# Patient Record
Sex: Female | Born: 1980 | Hispanic: No | Marital: Married | Smoking: Never smoker
Health system: Southern US, Community
[De-identification: ages and names within clinical notes are randomized; demographics above are authoritative.]

## PROBLEM LIST (undated history)

## (undated) DIAGNOSIS — E079 Disorder of thyroid, unspecified: Secondary | ICD-10-CM

---

## 2008-06-20 ENCOUNTER — Ambulatory Visit: Payer: Self-pay | Admitting: Family Medicine

## 2008-06-29 ENCOUNTER — Ambulatory Visit: Payer: Self-pay | Admitting: Family Medicine

## 2008-09-23 ENCOUNTER — Emergency Department: Payer: Self-pay | Admitting: Emergency Medicine

## 2009-01-22 ENCOUNTER — Observation Stay: Payer: Self-pay

## 2009-01-31 ENCOUNTER — Inpatient Hospital Stay: Payer: Self-pay

## 2010-03-01 IMAGING — US US OB < 14 WEEKS - US OB TV
1 series · 17 of 28 positions shown · non-contrast
Comparison: none

REASON FOR EXAM: acute abd pain    approx 5 weeks preg   eval ectopic
pregnancy   needs spain...
COMMENTS:

[Series 1: us ob < 14 weeks - us ob tv · 49 acquisitions, 17 frames shown]
[im 1/49]
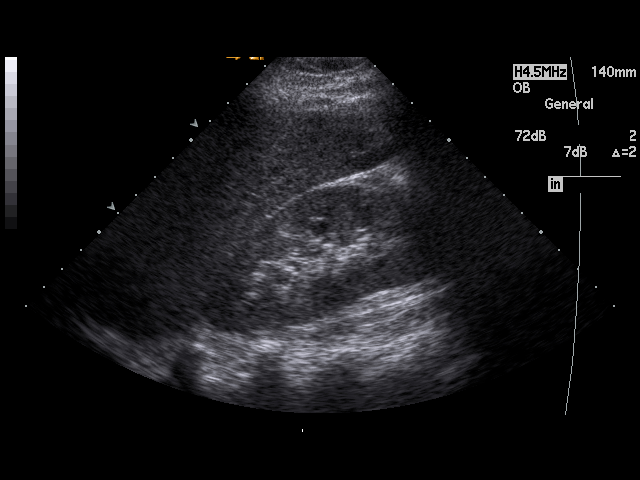
[im 4/49]
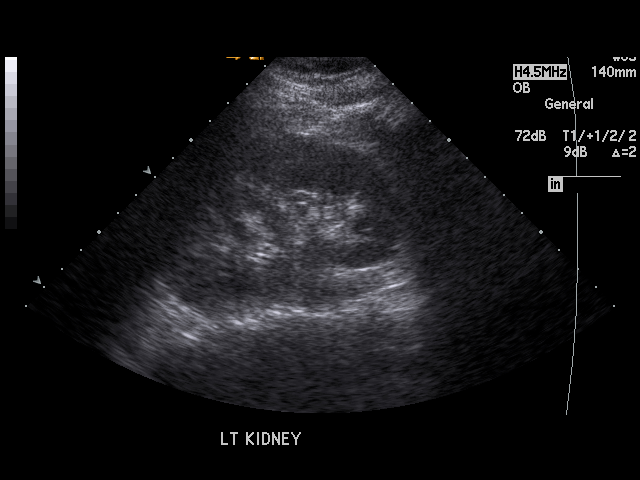
[im 8/49]
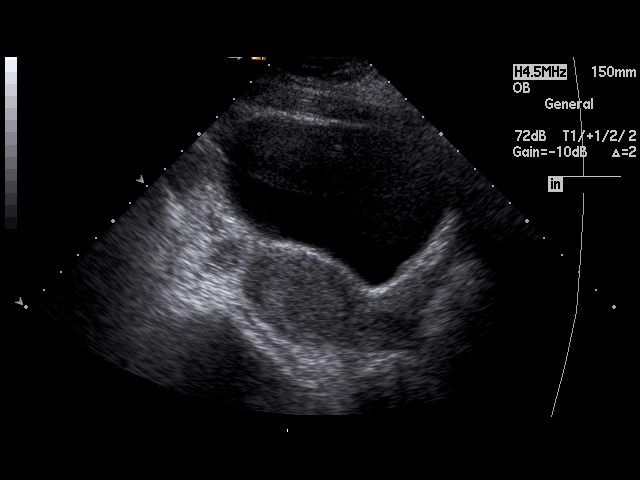
[im 9/49]
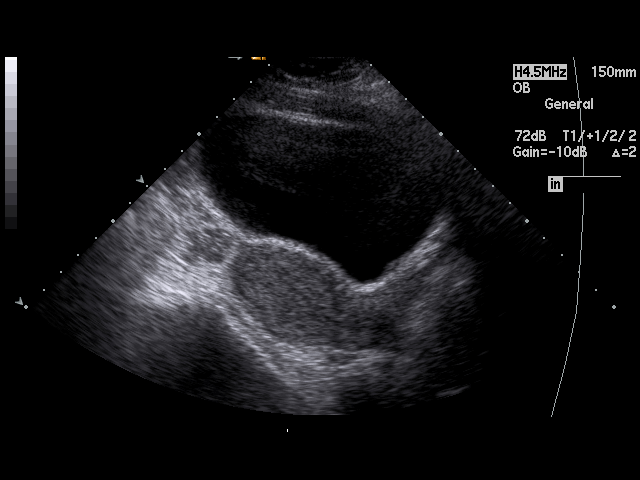
[im 13/49]
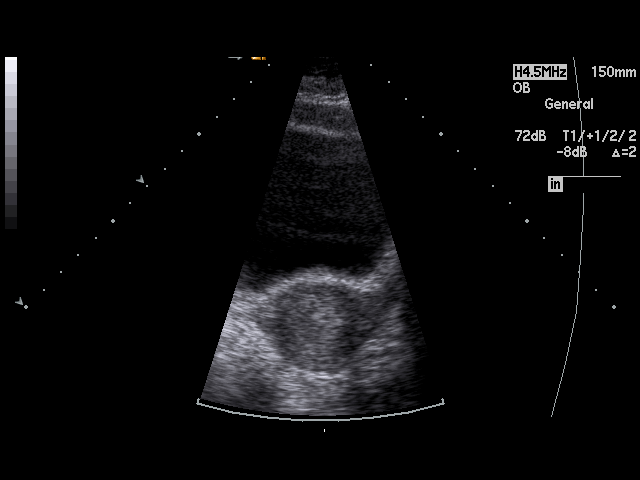
[im 17/49]
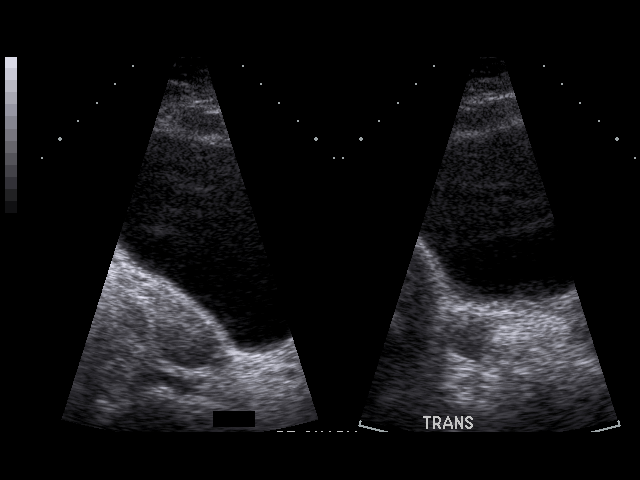
[im 18/49]
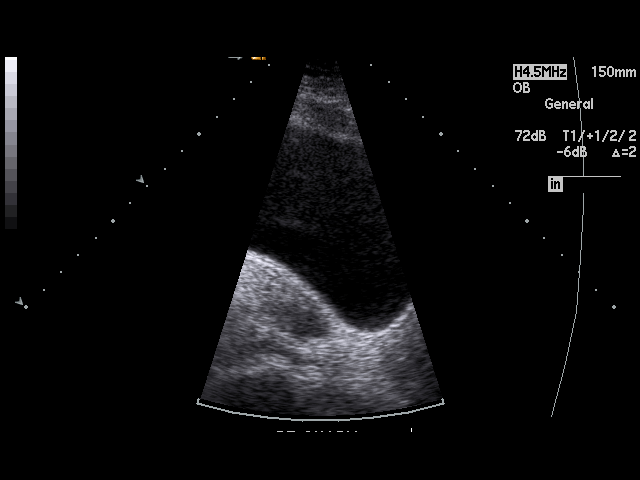
[im 22/49]
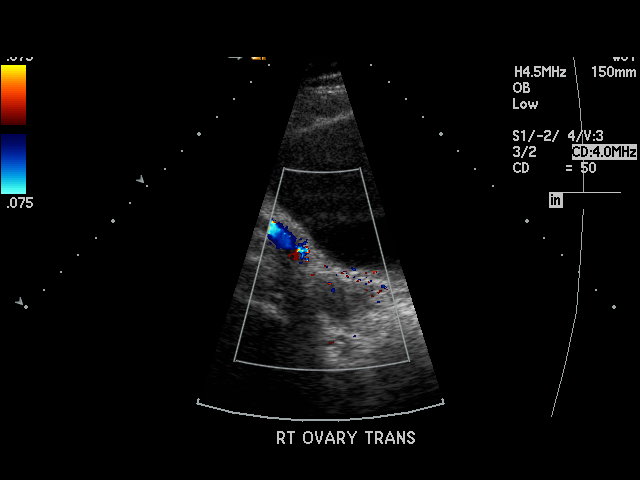
[im 25/49]
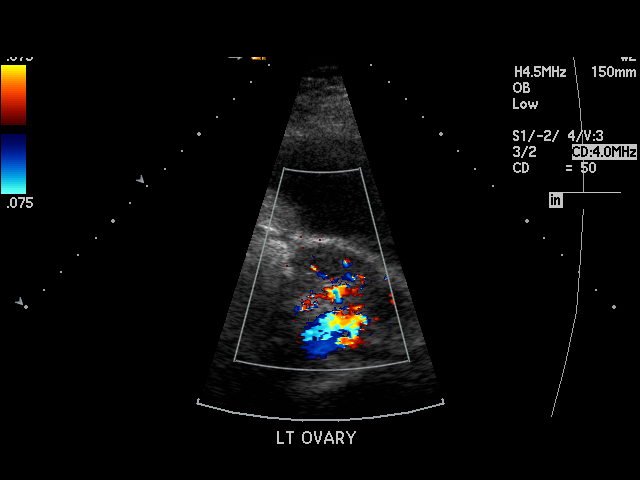
[im 27/49]
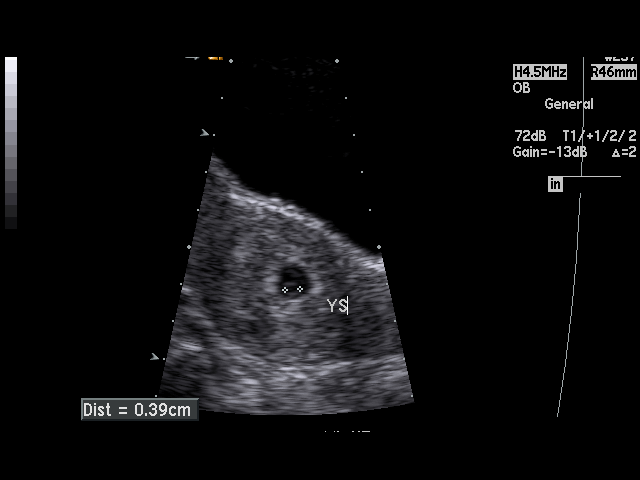
[im 31/49]
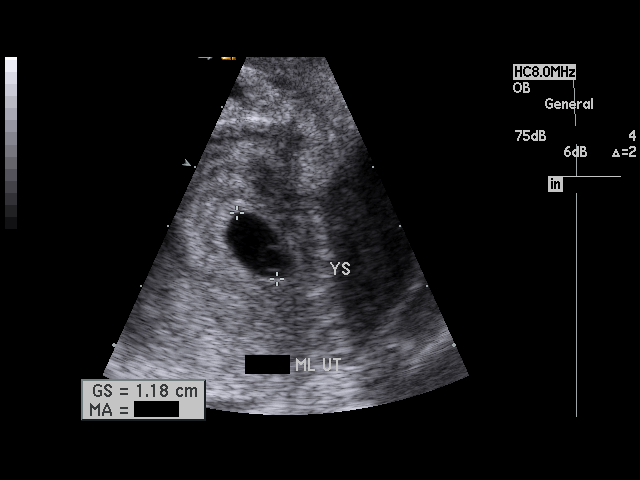
[im 33/49]
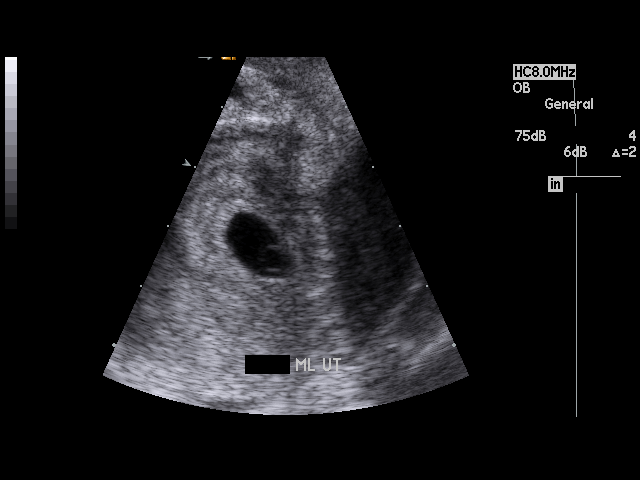
[im 36/49]
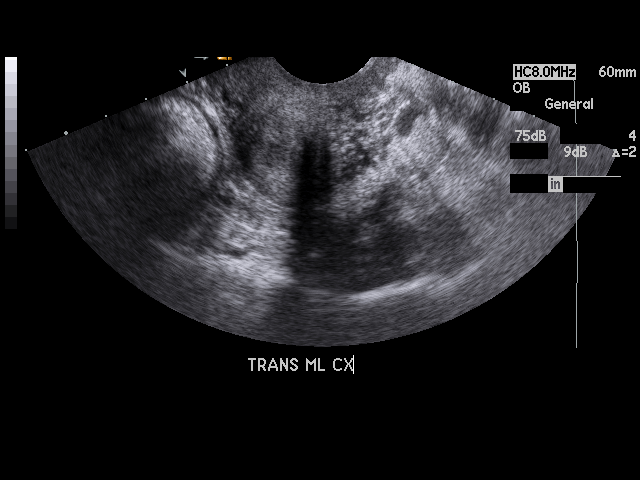
[im 40/49]
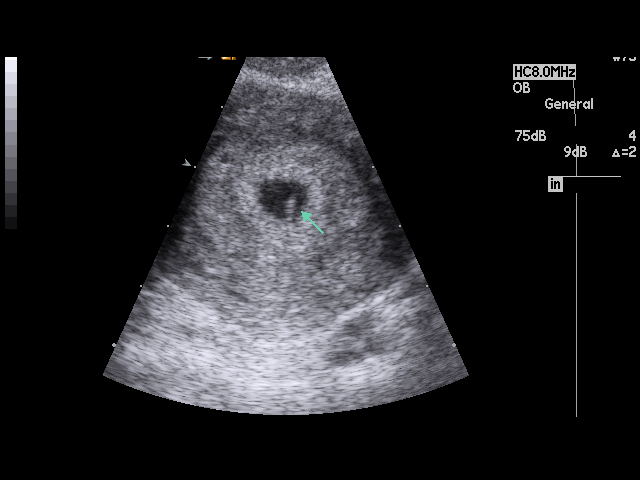
[im 41/49]
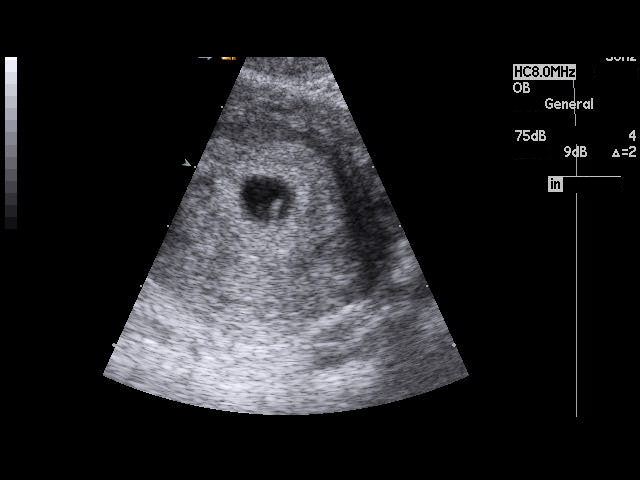
[im 45/49]
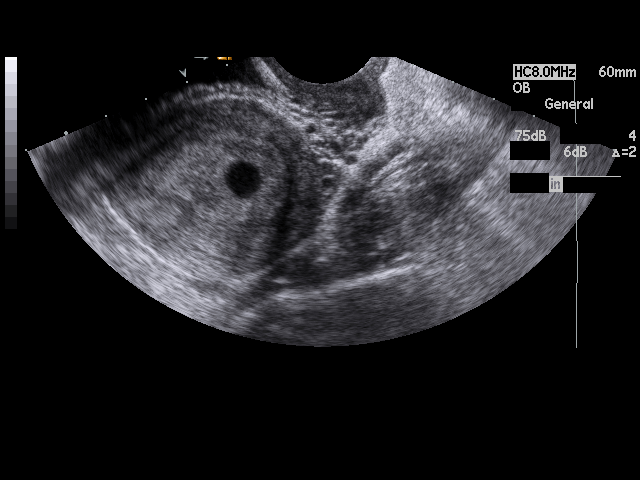
[im 49/49]
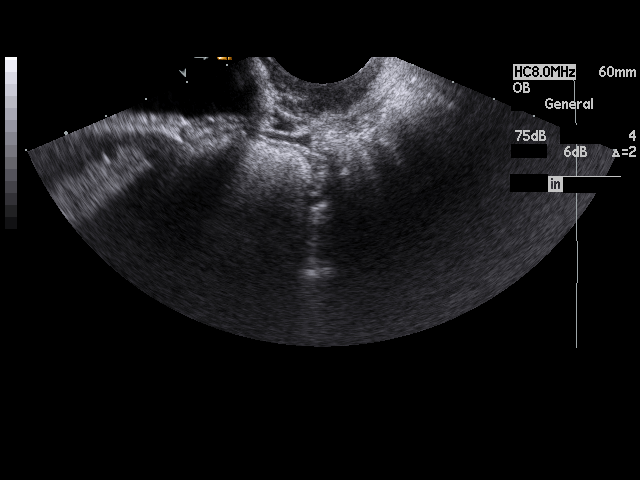

[17 of 28 positions shown; findings below may reference images not displayed]

PROCEDURE:     US  - US OB LESS THAN 14 WEEKS/W TRANS  - June 20, 2008  [DATE]

RESULT:     Ultrasound of the pelvis is performed utilizing an early OB
protocol. The maternal kidneys appear to be grossly normal with no
obstruction evident. The uterus measures 7.5 cm in length. Both ovaries are
present and appear unremarkable. There is evidence of blood flow to both
ovaries. There is an early intrauterine gestational sac with a 3.9 mm yolk
sac evident. Gestational sac diameter is 1.18 cm. There does appear to be a
fetal pole present measuring 2.7 mm. Crown-rump length gestational sac
diameter is approximately 5 weeks 2 days. Gestational age is approximately 5
weeks 4 days by average with an estimated delivery date of 02/16/2009. There
does appear to be evidence of cardiac activity. This is too small to
evaluate the cardiac rate. Routine followup is recommended.
IMPRESSION: Findings suggestive of a viable early intrauterine gestation of
approximately 5 weeks 4 days with an estimated delivery date of 02/16/2009.
Routine followup is recommended.

## 2010-03-10 IMAGING — US US THYROID
1 series · 17 of 25 positions shown · non-contrast
Comparison: No comparison

REASON FOR EXAM: thyroidmegaly
COMMENTS:

PROCEDURE:     US  - US THYROID  - June 29, 2008 [DATE]
RESULT:     Indication: Thyromegaly
TECHNIQUE: Multiple gray-scale and color-flow Doppler images of the thyroid
gland are presented for review.

[Series 1: us thyroid · 17 of 35 slices shown]
[im 1/35]
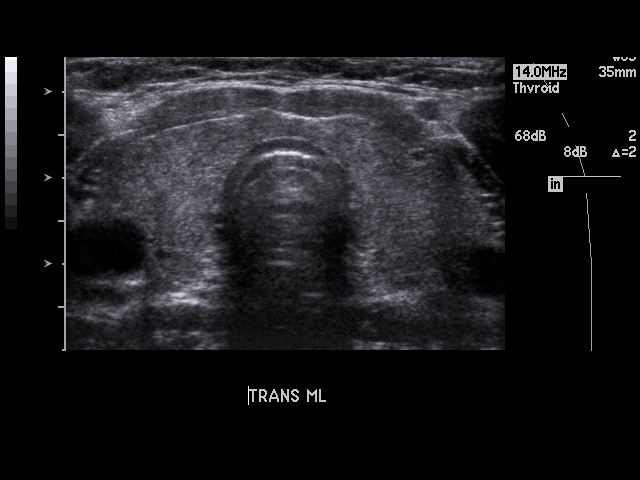
[im 3/35]
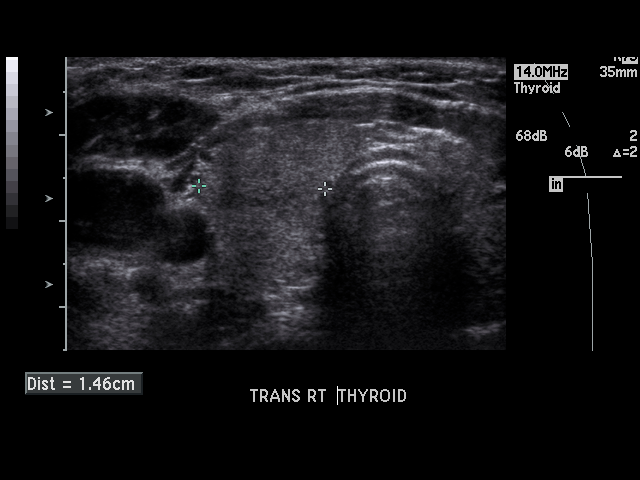
[im 5/35]
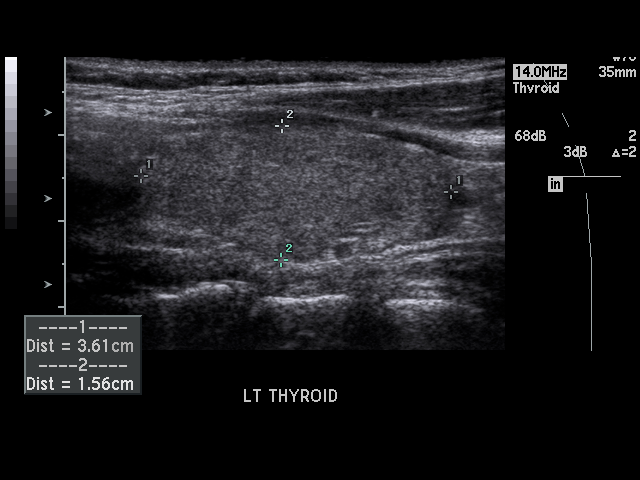
[im 8/35]
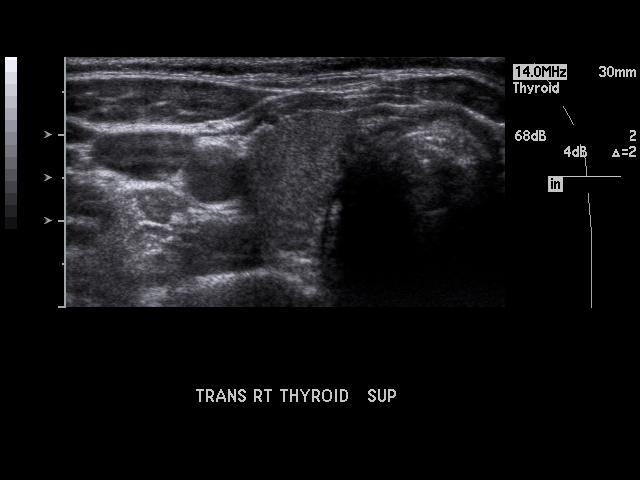
[im 9/35]
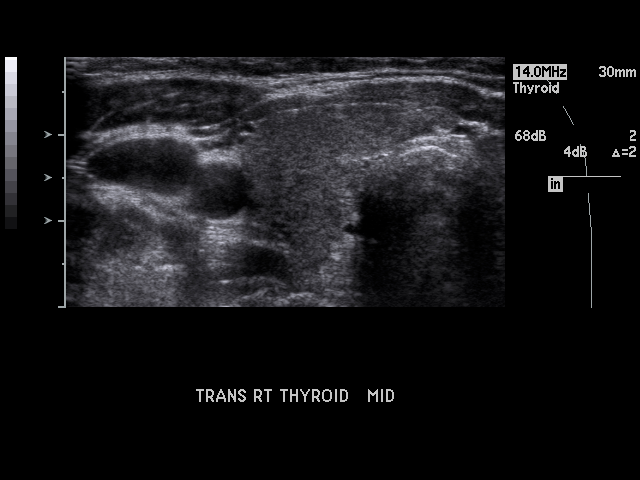
[im 12/35]
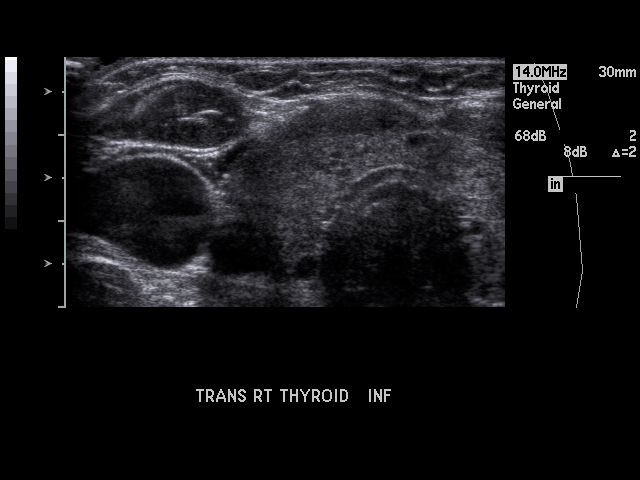
[im 13/35]
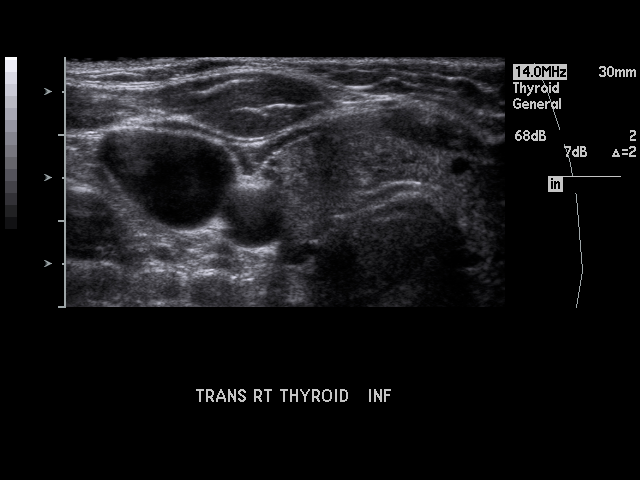
[im 16/35]
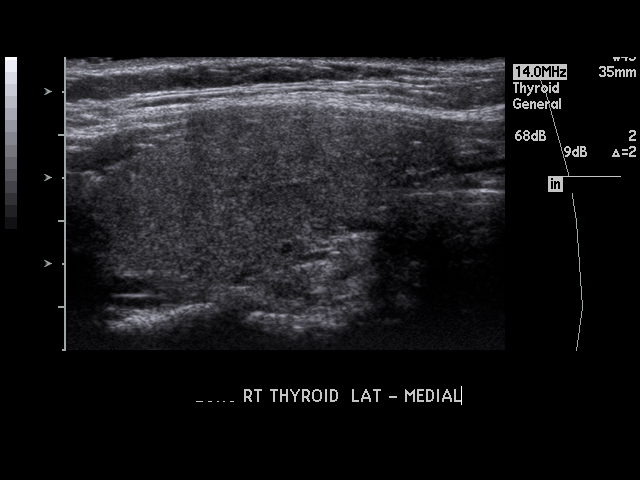
[im 18/35]
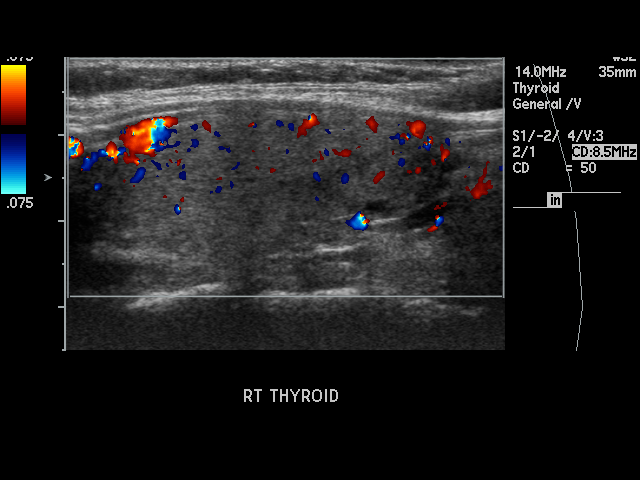
[im 19/35]
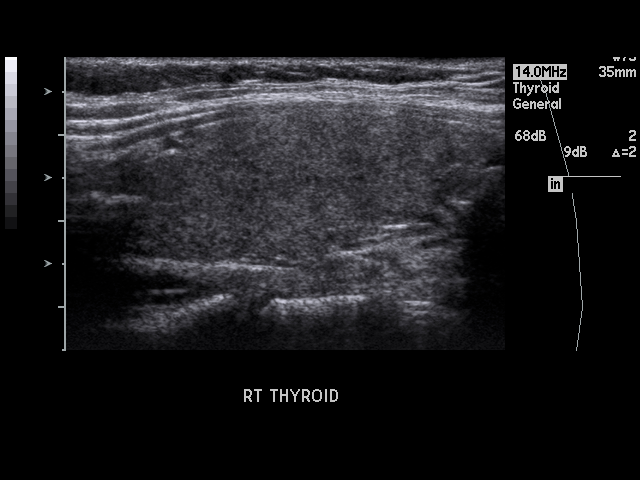
[im 22/35]
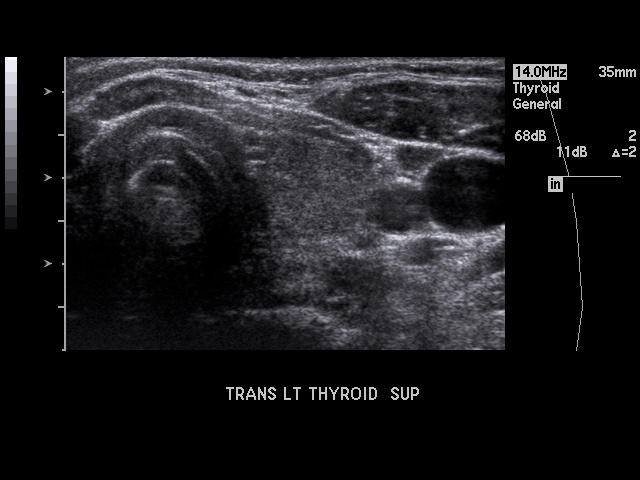
[im 23/35]
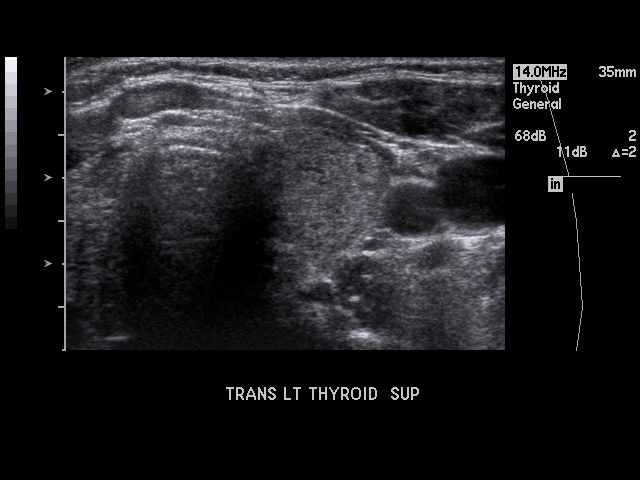
[im 26/35]
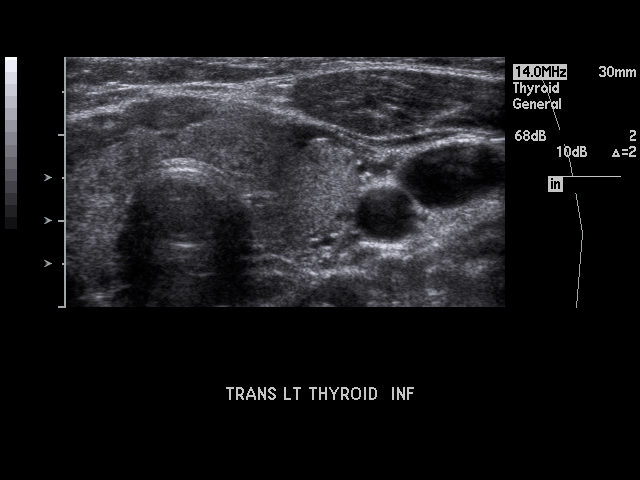
[im 27/35]
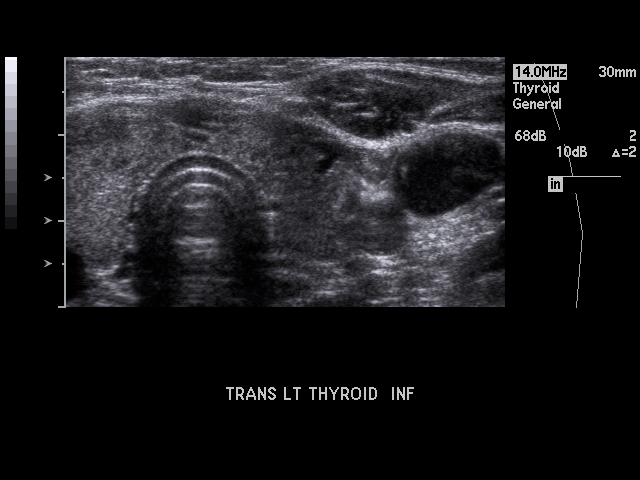
[im 30/35]
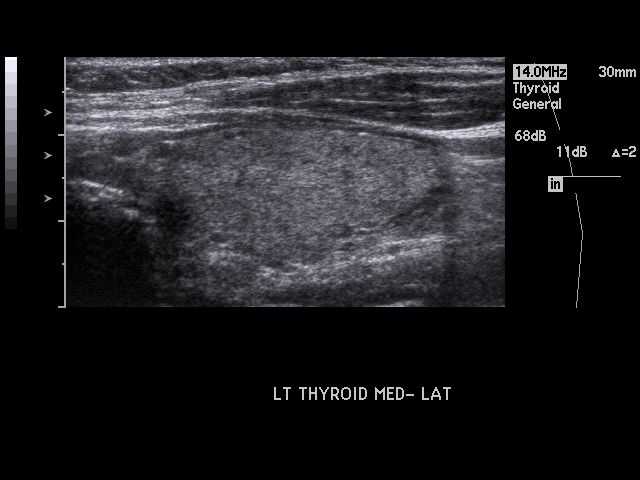
[im 32/35]
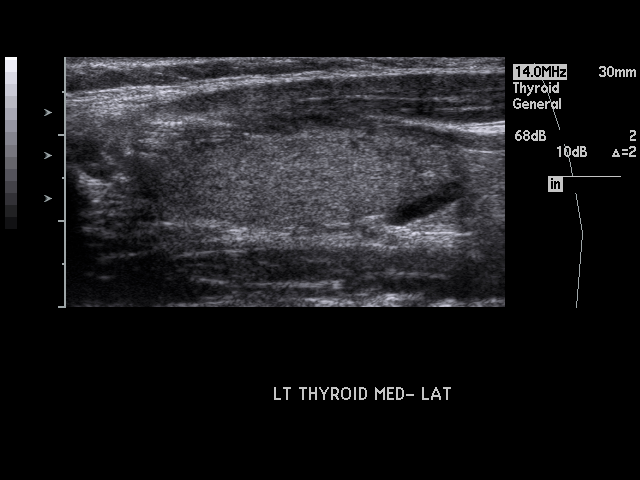
[im 35/35]
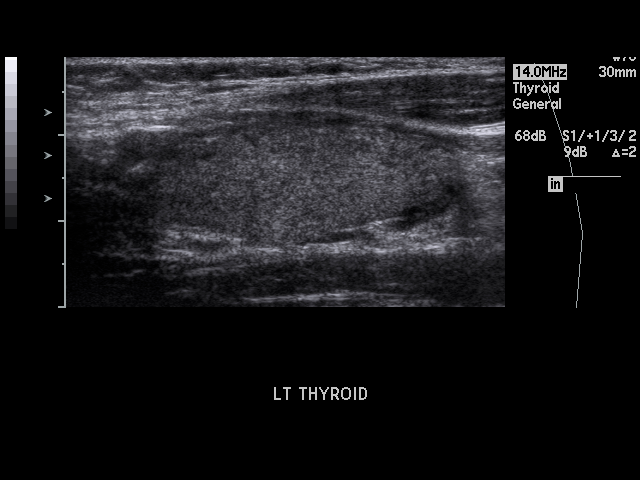

[17 of 25 positions shown; findings below may reference images not displayed]

FINDINGS: The right lobe of the thyroid gland measures 3.7 x 1.5 x 1.7 cm. The right
thyroid demonstrates normal echogenicity with no focal lesions identified.

The left lobe of the thyroid gland measures 3.6 x 1.6 x 1.6 cm. The left
thyroid demonstrates normal echogenicity with no focal lesions identified.

The thyroid isthmus is normal measuring 0.3 cm.
IMPRESSION: Normal thyroid ultrasound.

## 2010-03-22 ENCOUNTER — Ambulatory Visit: Payer: Self-pay | Admitting: Family Medicine

## 2010-08-10 ENCOUNTER — Observation Stay: Payer: Self-pay

## 2010-08-14 ENCOUNTER — Observation Stay: Payer: Self-pay

## 2010-08-15 ENCOUNTER — Inpatient Hospital Stay: Payer: Self-pay | Admitting: Obstetrics & Gynecology

## 2011-12-01 IMAGING — US US OB US >=[ID] SNGL FETUS
1 series · 17 of 28 positions shown · non-contrast
Comparison: none

REASON FOR EXAM: 2nd trimester US
COMMENTS:

[Series 1: us ob us >=(id) sngl fetus · 17 of 57 slices shown]
[im 1/57]
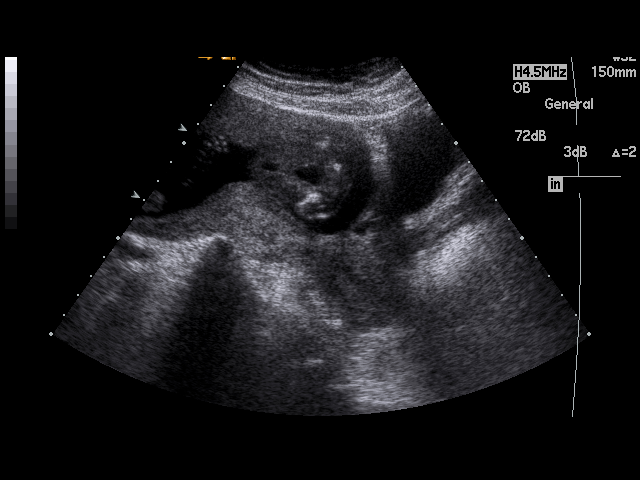
[im 5/57]
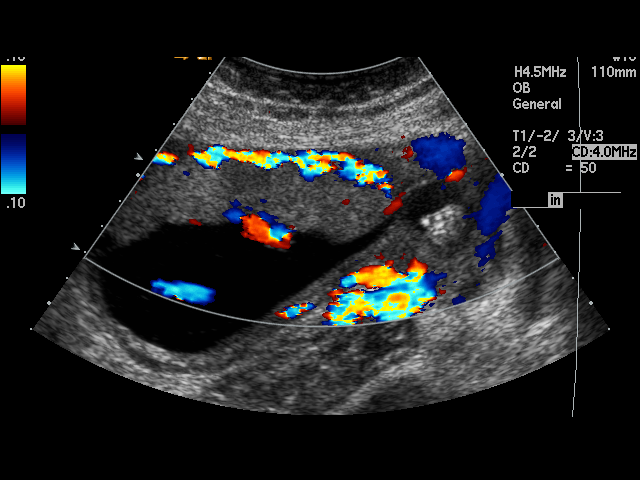
[im 9/57]
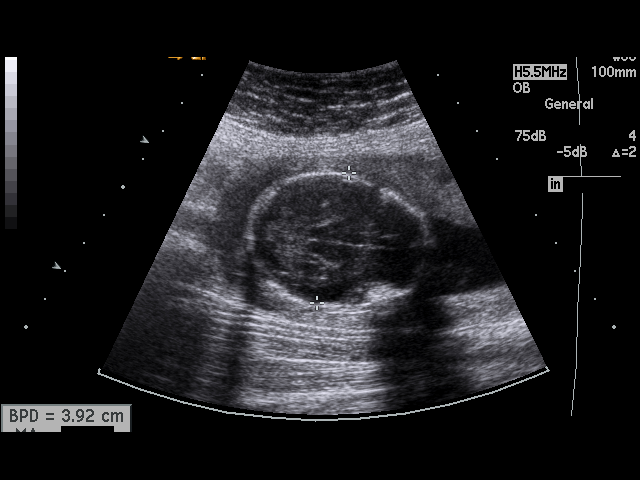
[im 11/57]
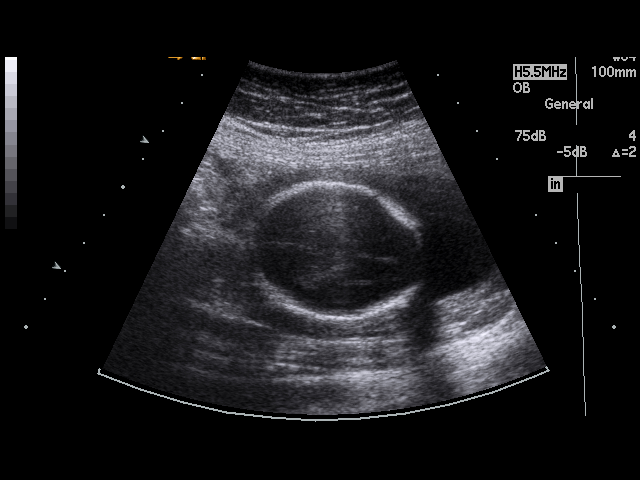
[im 15/57]
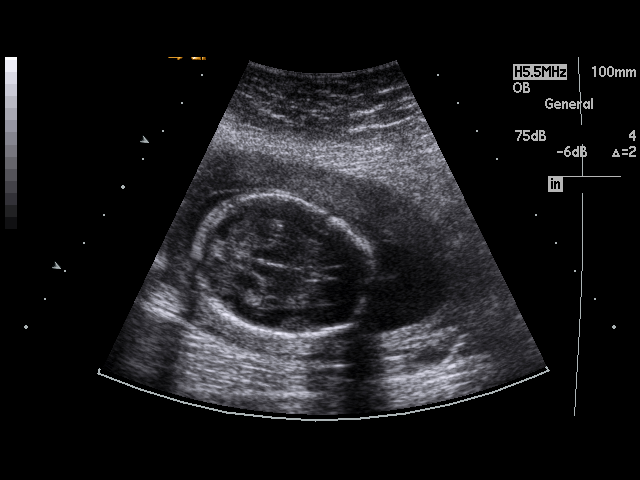
[im 19/57]
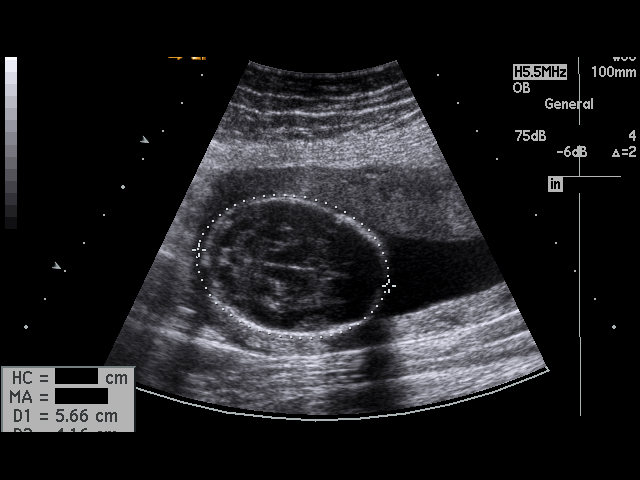
[im 21/57]
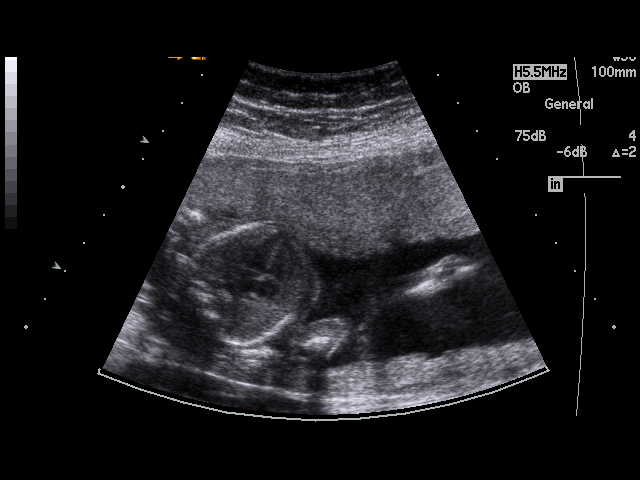
[im 25/57]
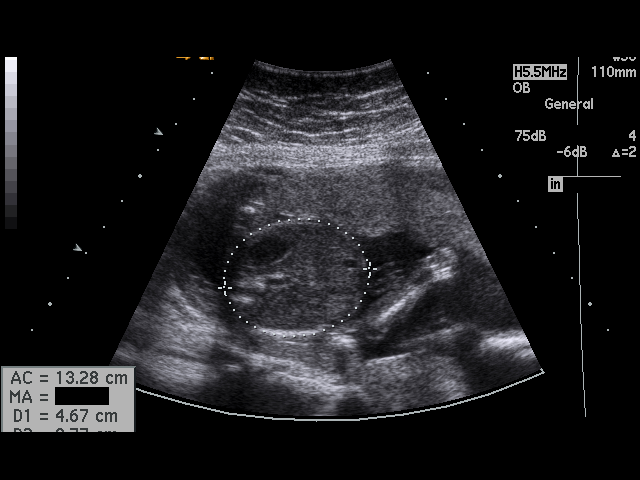
[im 30/57]
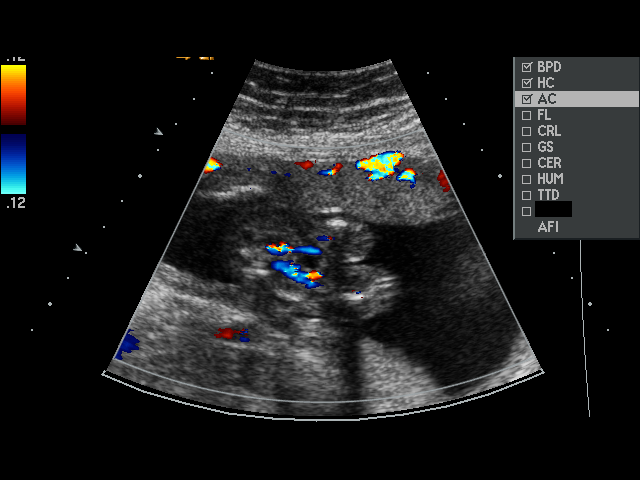
[im 32/57]
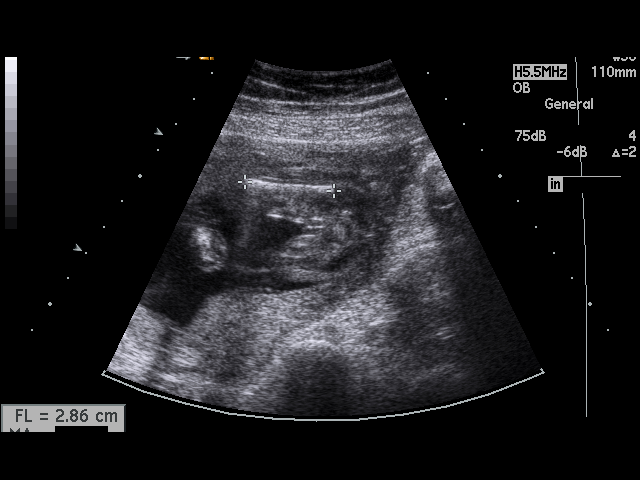
[im 36/57]
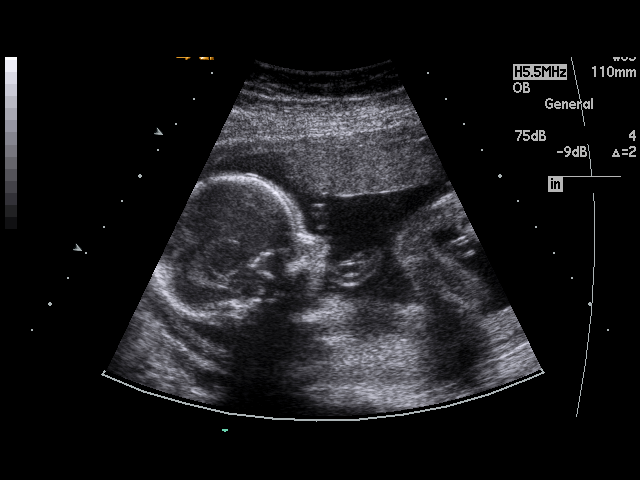
[im 38/57]
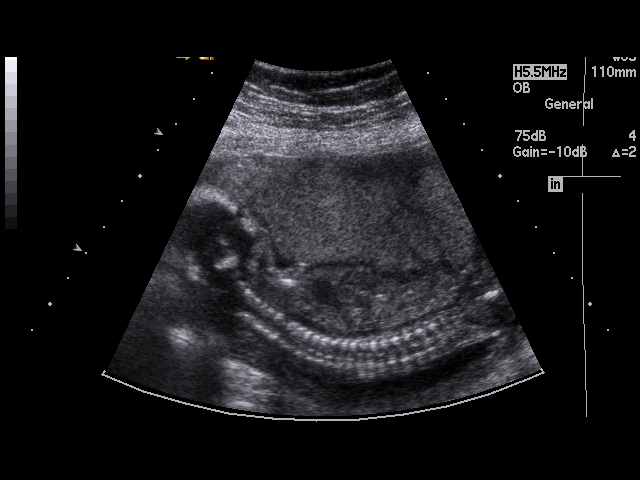
[im 42/57]
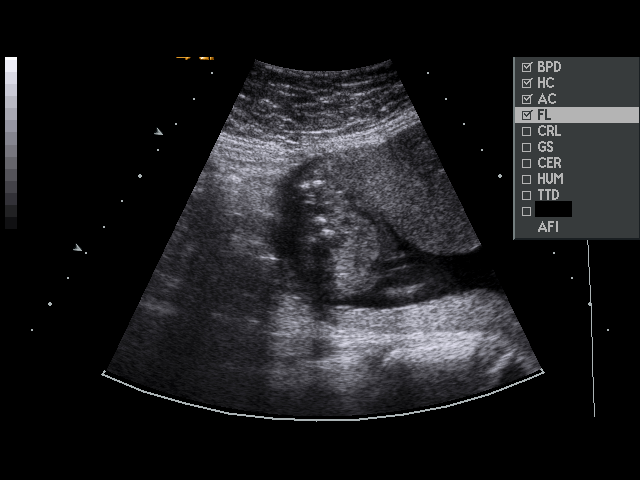
[im 46/57]
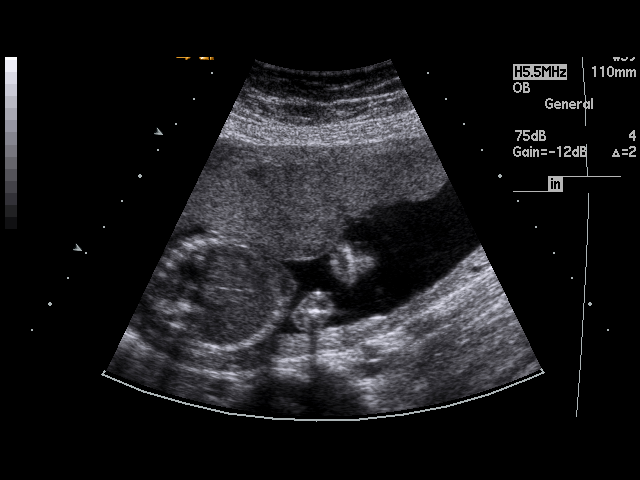
[im 48/57]
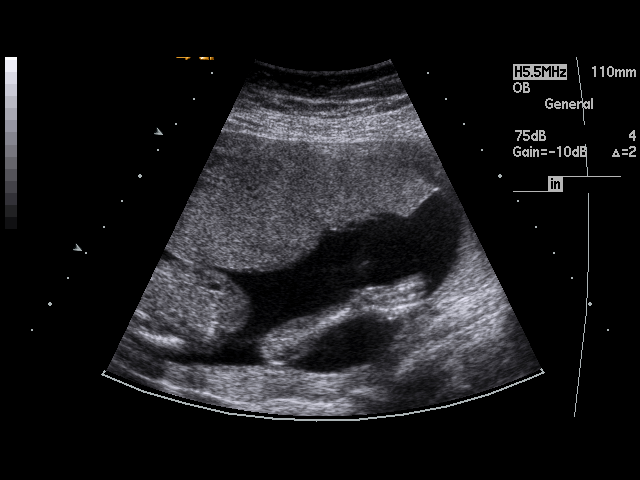
[im 52/57]
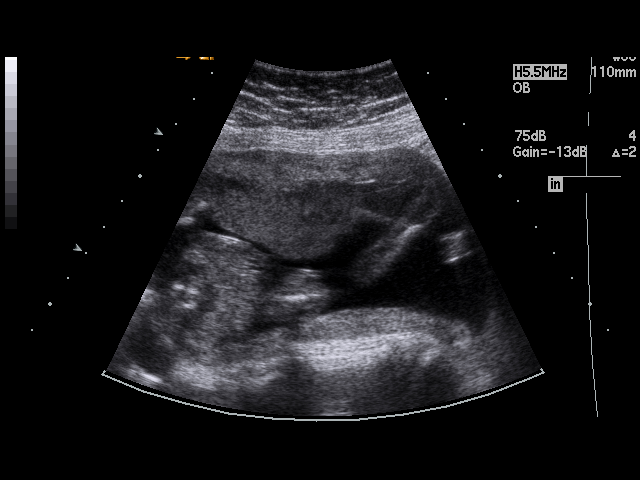
[im 57/57]
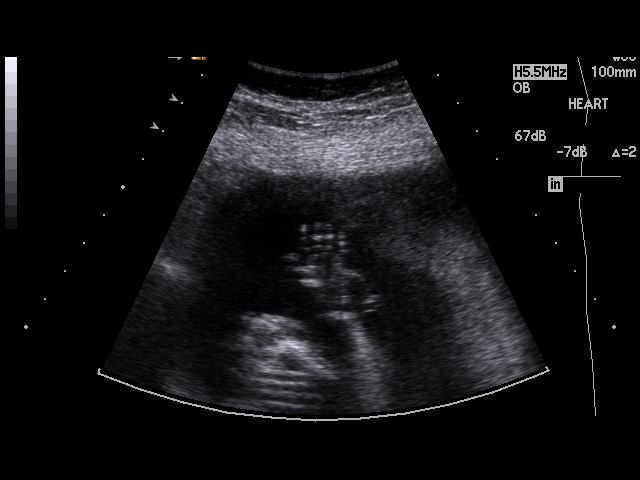

[17 of 28 positions shown; findings below may reference images not displayed]

PROCEDURE:     US  - US OB GREATER/OR EQUAL TO OM6CG  - March 22, 2010 [DATE]

RESULT:     There is observed a single living intrauterine gestation.
Presentation currently is breech. Amnionic fluid volume appears normal.
Fetal heart rate was monitored at 138 beats per minute. The placenta is
anterior. The inferior tip of the placenta is approximately 4.38 cm above
the cervix. Cervix length measures 4.59 cm. The fetal heart, stomach, and
urinary bladder are visualized. No hydrocephalus or hydronephrosis is seen.
No fetal abnormalities are detected. Fetal measurements are as follows:

BPD: 3.92 cm corresponding to 18 weeks-2 days
HC: 15.49 cm corresponding to 18 weeks-4 days
AC: 13.16 cm corresponding to 18 weeks-5 days
FL: 2.88 cm corresponding to 18 weeks-7 days

EFW is 277 grams + / - 37 grams. Average ultrasound age is 18 weeks-4 days.
Ultrasound EDD is August 20, 2010.
IMPRESSION: 1.     Please see above.

## 2014-03-13 ENCOUNTER — Ambulatory Visit: Payer: Self-pay | Admitting: Family Medicine

## 2014-03-30 DIAGNOSIS — O99345 Other mental disorders complicating the puerperium: Secondary | ICD-10-CM | POA: Insufficient documentation

## 2014-03-30 DIAGNOSIS — F53 Postpartum depression: Secondary | ICD-10-CM | POA: Insufficient documentation

## 2014-03-30 DIAGNOSIS — E039 Hypothyroidism, unspecified: Secondary | ICD-10-CM | POA: Insufficient documentation

## 2014-03-30 DIAGNOSIS — Z3490 Encounter for supervision of normal pregnancy, unspecified, unspecified trimester: Secondary | ICD-10-CM | POA: Insufficient documentation

## 2014-03-30 DIAGNOSIS — K219 Gastro-esophageal reflux disease without esophagitis: Secondary | ICD-10-CM | POA: Insufficient documentation

## 2014-04-19 ENCOUNTER — Ambulatory Visit: Payer: Self-pay | Admitting: Obstetrics and Gynecology

## 2014-08-14 ENCOUNTER — Observation Stay
Admit: 2014-08-14 | Disposition: A | Discharge status: Home / Self Care | Payer: Self-pay | Attending: Obstetrics and Gynecology | Admitting: Obstetrics and Gynecology

## 2014-08-14 ENCOUNTER — Inpatient Hospital Stay
Admit: 2014-08-14 | Disposition: A | Discharge status: Home / Self Care | Payer: Self-pay | Attending: Obstetrics and Gynecology | Admitting: Obstetrics and Gynecology

## 2014-08-14 LAB — CBC WITH DIFFERENTIAL/PLATELET
BASOS PCT: 0.3 %
Basophil #: 0 10*3/uL (ref 0.0–0.1)
EOS ABS: 0.1 10*3/uL (ref 0.0–0.7)
Eosinophil %: 0.7 %
HCT: 38.5 % (ref 35.0–47.0)
HGB: 12.5 g/dL (ref 12.0–16.0)
Lymphocyte #: 1.7 10*3/uL (ref 1.0–3.6)
Lymphocyte %: 14.2 %
MCH: 28.6 pg (ref 26.0–34.0)
MCHC: 32.5 g/dL (ref 32.0–36.0)
MCV: 88 fL (ref 80–100)
Monocyte #: 0.4 x10 3/mm (ref 0.2–0.9)
Monocyte %: 3.4 %
NEUTROS ABS: 9.8 10*3/uL — AB (ref 1.4–6.5)
Neutrophil %: 81.4 %
Platelet: 328 10*3/uL (ref 150–440)
RBC: 4.38 10*6/uL (ref 3.80–5.20)
RDW: 14 % (ref 11.5–14.5)
WBC: 12 10*3/uL — ABNORMAL HIGH (ref 3.6–11.0)

## 2014-08-15 LAB — HEMATOCRIT: HCT: 33.3 % — ABNORMAL LOW (ref 35.0–47.0)

## 2014-09-19 NOTE — H&P (Signed)
L&D Evaluation:  History:  HPI 34 y.o. [id number] married [place] female at 38.1 weeks, EDD 08/27/2014 by LMP 11/20/13, who presents to L&D with c/o worsening contractions since 2 a.m.  Patient was seen in L&D earlier this morning for ctx diagnosed latent labor (cervix 2 cm with no change) and sent home.  Patient sent over from office today due to c/o worsening ctx, cervix 6/70/-3/c.  Denies LOF, vaginal bleeding, and notes good FM.   Presents with contractions   Patient's Medical History Thyroid Disease  Hypothyroidism, GERD in pregnancy. SVD x 2.  Transferred care from Phine[facility] Realharles Drew in late second trimester.   Patient's Surgical History none   Medications Pre Natal Vitamins  Levothyroxine 75 mcg daily, Zantac 150 mg daily   Allergies NKDA   Social History none   Family History Non-Contributory   ROS:  General in moderate distress with contractions   HEENT normal   CNS normal   GI normal   GU cervix 7-8/80/c/-3   Resp normal   CV normal   Renal normal   MS normal   Exam:  Vital Signs stable   Urine Protein not completed   General no apparent distress   Mental Status clear   Chest clear   Heart normal sinus rhythm, no murmur/gallop/rubs   Abdomen gravid, non-tender   Estimated Fetal Weight Average for gestational age   Fetal Position cephalic   Back no CVAT   Edema no edema   Pelvic no external lesions, 7-8/80/c/-3   Mebranes Intact   FHT normal rate with no decels   FHT Description early decelerations   Fetal Heart Rate 140   Ucx regular   Ucx Frequency 5 min   Skin dry   Lymph no lymphadenopathy   Other A/-/ND/NR/RI/VZI/HIV-/GBS+/GC-/Cl-.   Glucola wnl. 08/14/14: 12.0>12.5/38.5<328   Impression:  Impression active labor   Plan:  Plan EFM/NST, monitor contractions and for cervical change, antibiotics for GBBS prophylaxis   Comments Admit to Labor and Delivery Desires epidural.  To decrease to prepartum dose of Levothyroxine  postpartum and f/u labs in 6-8 weeks.   Electronic Signatures: Fabian Novemberherry, Mikaiah Stoffer S (MD)  (Signed 04-Apr-16 12:49)  Authored: L&D Evaluation   Last Updated: 04-Apr-16 12:49 by Fabian Novemberherry, Kelci Petrella S (MD)

## 2015-03-29 ENCOUNTER — Ambulatory Visit: Payer: Self-pay | Admitting: Obstetrics and Gynecology

## 2016-06-25 ENCOUNTER — Ambulatory Visit: Payer: Self-pay | Admitting: Ophthalmology

## 2016-08-07 ENCOUNTER — Ambulatory Visit: Payer: Self-pay | Admitting: Ophthalmology

## 2016-08-21 ENCOUNTER — Ambulatory Visit: Payer: Self-pay | Admitting: Ophthalmology

## 2016-09-11 ENCOUNTER — Ambulatory Visit: Payer: Self-pay | Admitting: Ophthalmology

## 2016-10-02 ENCOUNTER — Ambulatory Visit: Payer: Self-pay | Admitting: Ophthalmology

## 2017-04-11 ENCOUNTER — Emergency Department
Admission: EM | Admit: 2017-04-11 | Discharge: 2017-04-12 | Disposition: A | Discharge status: Home / Self Care | Payer: Medicaid Other | Attending: Emergency Medicine | Admitting: Emergency Medicine

## 2017-04-11 ENCOUNTER — Other Ambulatory Visit: Payer: Self-pay

## 2017-04-11 DIAGNOSIS — L2389 Allergic contact dermatitis due to other agents: Secondary | ICD-10-CM | POA: Diagnosis not present

## 2017-04-11 DIAGNOSIS — L509 Urticaria, unspecified: Secondary | ICD-10-CM | POA: Diagnosis present

## 2017-04-11 NOTE — ED Triage Notes (Signed)
Pt arrives ambulatory to triage with c/o rash over body. Pt has spreading rash over legs and now arms. Rash is red but does not reach to or effect breathing. Pt states that she took allergy medication around 2230 and has taken this a total of four times over the last two days. Pt is in NAD.

## 2017-04-12 MED ORDER — HYDROXYZINE HCL 50 MG PO TABS
50.0000 mg | ORAL_TABLET | Freq: Once | ORAL | Status: AC
  Administered 2017-04-12: 50 mg via ORAL
  Filled 2017-04-12: qty 1

## 2017-04-12 MED ORDER — HYDROXYZINE HCL 50 MG PO TABS: 50.0000 mg | ORAL_TABLET | Freq: Three times a day (TID) | ORAL | 0 refills | Status: DC | PRN

## 2017-04-12 MED ORDER — TRAMADOL HCL 50 MG PO TABS
50.0000 mg | ORAL_TABLET | Freq: Once | ORAL | Status: AC
  Administered 2017-04-12: 50 mg via ORAL
  Filled 2017-04-12: qty 1

## 2017-04-12 MED ORDER — METHYLPREDNISOLONE SODIUM SUCC 125 MG IJ SOLR
125.0000 mg | Freq: Once | INTRAMUSCULAR | Status: DC
  Filled 2017-04-12: qty 2

## 2017-04-12 MED ORDER — METHYLPREDNISOLONE SODIUM SUCC 125 MG IJ SOLR
125.0000 mg | Freq: Once | INTRAMUSCULAR | Status: AC
  Administered 2017-04-12: 125 mg via INTRAMUSCULAR

## 2017-04-12 MED ORDER — TRAMADOL HCL 50 MG PO TABS: 50.0000 mg | ORAL_TABLET | Freq: Four times a day (QID) | ORAL | 0 refills | Status: DC | PRN

## 2017-04-12 MED ORDER — METHYLPREDNISOLONE 4 MG PO TBPK: ORAL_TABLET | ORAL | 0 refills | Status: DC

## 2017-04-12 NOTE — ED Provider Notes (Signed)
[facility] Emergency Department Provider Note   ____________________________________________   First MD Initiated Contact with Patient 04/12/17 0015     (approximate)  I have reviewed the triage vital signs and the nursing notes.   HISTORY  Chief Complaint Urticaria    HPI Beth Strong is a 36 y.o. female patient presented for rash to the upper and lower extremities for 2 days. This rash is spared the face and trunk. Patient stated no relief taking over-the-counter Benadryl. Patient cannot remember any new personal hygiene products in the past week. Patient  Wheals with intense itching. Patient rates her pain discomfort as a 5/10.   No past medical history on file.  There are no active problems to display for this patient.     Prior to Admission medications   Medication Sig Start Date End Date Taking? Authorizing Provider  hydrOXYzine (ATARAX/VISTARIL) 50 MG tablet Take 1 tablet (50 mg total) by mouth 3 (three) times daily as needed. 04/12/17   Joni ReiningSmith, Esmirna Ravan K, PA-C  methylPREDNISolone (MEDROL DOSEPAK) 4 MG TBPK tablet Take Tapered dose as directed 04/12/17   Joni ReiningSmith, Amiera Herzberg K, PA-C  traMADol (ULTRAM) 50 MG tablet Take 1 tablet (50 mg total) by mouth every 6 (six) hours as needed for moderate pain. 04/12/17   Joni ReiningSmith, Lashaun Poch K, PA-C    Allergies Patient has no known allergies.  Family History  Problem Relation Age of Onset  . Diabetes Mother     Social History Social History   Tobacco Use  . Smoking status: Never Smoker  Substance Use Topics  . Alcohol use: No  . Drug use: No    Review of Systems Constitutional: No fever/chills Eyes: No visual changes. ENT: No sore throat. Cardiovascular: Denies chest pain. Respiratory: Denies shortness of breath. Gastrointestinal: No abdominal pain.  No nausea, no vomiting.  No diarrhea.  No constipation. Genitourinary: Negative for dysuria. Musculoskeletal: Negative for back pain. Skin: Positive for  rash. Neurological: Negative for headaches, focal weakness or numbness.   ____________________________________________   PHYSICAL EXAM:  VITAL SIGNS: ED Triage Vitals  Enc Vitals Group     BP 04/11/17 2229 115/65     Pulse Rate 04/11/17 2229 (!) 105     Resp 04/11/17 2229 18     Temp 04/11/17 2229 (!) 97.3 F (36.3 C)     Temp Source 04/11/17 2229 Oral     SpO2 04/11/17 2229 100 %     Weight 04/11/17 2229 155 lb (70.3 kg)     Height 04/11/17 2229 5\' 1"  (1.549 m)     Head Circumference --      Peak Flow --      Pain Score 04/11/17 2238 5     Pain Loc --      Pain Edu? --      Excl. in GC? --    Constitutional: Alert and oriented. Well appearing and in no acute distress. Cardiovascular: Normal rate, regular rhythm. Grossly normal heart sounds.  Good peripheral circulation. Respiratory: Normal respiratory effort.  No retractions. Lungs CTAB. Neurologic:  Normal speech and language. No gross focal neurologic deficits are appreciated. No gait instability. Skin:  Skin is warm, dry and intact. Rash confined to the upper and lower extremities. Psychiatric: Mood and affect are normal. Speech and behavior are normal.  ____________________________________________   LABS (all labs ordered are listed, but only abnormal results are displayed)  Labs Reviewed - No data to display ____________________________________________  EKG   ____________________________________________  RADIOLOGY  No  results found.  ____________________________________________   PROCEDURES  Procedure(s) performed: None  Procedures  Critical Care performed: No  ____________________________________________   INITIAL IMPRESSION / ASSESSMENT AND PLAN / ED COURSE  As part of my medical decision making, I reviewed the following data within the electronic MEDICAL RECORD NUMBER    Rash secondary to contact dermatitis. Patient given discharge Instructions. Patient advised take medication as directed  and follow with PCP if no improvement in 3-5 days. Return back to ED if condition worsens.      ____________________________________________   FINAL CLINICAL IMPRESSION(S) / ED DIAGNOSES  Final diagnoses:  Allergic contact dermatitis due to other agents     ED Discharge Orders        Ordered    methylPREDNISolone (MEDROL DOSEPAK) 4 MG TBPK tablet     04/12/17 0023    hydrOXYzine (ATARAX/VISTARIL) 50 MG tablet  3 times daily PRN     04/12/17 0023    traMADol (ULTRAM) 50 MG tablet  Every 6 hours PRN     04/12/17 0023       Note:  This document was prepared using Dragon voice recognition software and may include unintentional dictation errors.    Joni ReiningSmith, Godfrey Tritschler K, PA-C 04/12/17 Valentina Lucks0032    Phineas SemenGoodman, Graydon, MD 04/12/17 [phone]

## 2017-04-12 NOTE — ED Notes (Signed)
Pt with red raised rash noted to bilateral foreams and legs. Pt states radiates from below groin. Noted rash is worse on lower extremities. Pt states it itches. Last benadryl pta per pt. Pt without resp distress or angioedema noted.

## 2017-04-12 NOTE — Discharge Instructions (Addendum)
when the rash is confined to only certain certain parts of the body it is normally from coming in contact with an irritation. Try to determine what new products he had applied to your body.

## 2017-04-16 ENCOUNTER — Emergency Department
Admission: EM | Admit: 2017-04-16 | Discharge: 2017-04-17 | Disposition: A | Discharge status: Home / Self Care | Payer: Medicaid Other | Attending: Emergency Medicine | Admitting: Emergency Medicine

## 2017-04-16 DIAGNOSIS — R21 Rash and other nonspecific skin eruption: Secondary | ICD-10-CM | POA: Diagnosis present

## 2017-04-16 HISTORY — DX: Disorder of thyroid, unspecified: E07.9

## 2017-04-16 LAB — CBC WITH DIFFERENTIAL/PLATELET
Basophils Absolute: 0.1 10*3/uL (ref 0–0.1)
Basophils Relative: 0 %
EOS ABS: 0 10*3/uL (ref 0–0.7)
EOS PCT: 0 %
HCT: 35.9 % (ref 35.0–47.0)
Hemoglobin: 12.1 g/dL (ref 12.0–16.0)
LYMPHS ABS: 3 10*3/uL (ref 1.0–3.6)
Lymphocytes Relative: 22 %
MCH: 28.3 pg (ref 26.0–34.0)
MCHC: 33.8 g/dL (ref 32.0–36.0)
MCV: 83.5 fL (ref 80.0–100.0)
MONO ABS: 0.8 10*3/uL (ref 0.2–0.9)
Monocytes Relative: 6 %
Neutro Abs: 10 10*3/uL — ABNORMAL HIGH (ref 1.4–6.5)
Neutrophils Relative %: 72 %
PLATELETS: 339 10*3/uL (ref 150–440)
RBC: 4.3 MIL/uL (ref 3.80–5.20)
RDW: 13.3 % (ref 11.5–14.5)
WBC: 13.9 10*3/uL — ABNORMAL HIGH (ref 3.6–11.0)

## 2017-04-16 LAB — COMPREHENSIVE METABOLIC PANEL
ALT: 14 U/L (ref 14–54)
AST: 17 U/L (ref 15–41)
Albumin: 4 g/dL (ref 3.5–5.0)
Alkaline Phosphatase: 56 U/L (ref 38–126)
Anion gap: 10 (ref 5–15)
BUN: 11 mg/dL (ref 6–20)
CHLORIDE: 99 mmol/L — AB (ref 101–111)
CO2: 27 mmol/L (ref 22–32)
Calcium: 8.9 mg/dL (ref 8.9–10.3)
Creatinine, Ser: 0.54 mg/dL (ref 0.44–1.00)
GFR calc Af Amer: >60 mL/min (ref >60)
GFR calc non Af Amer: >60 mL/min (ref >60)
GLUCOSE: 116 mg/dL — AB (ref 65–99)
Potassium: 3.9 mmol/L (ref 3.5–5.1)
SODIUM: 136 mmol/L (ref 135–145)
Total Bilirubin: 0.3 mg/dL (ref 0.3–1.2)
Total Protein: 8.5 g/dL — ABNORMAL HIGH (ref 6.5–8.1)

## 2017-04-16 LAB — URINALYSIS, COMPLETE (UACMP) WITH MICROSCOPIC
Bacteria, UA: NONE SEEN
Bilirubin Urine: NEGATIVE
Glucose, UA: NEGATIVE mg/dL
Ketones, ur: NEGATIVE mg/dL
Leukocytes, UA: NEGATIVE
Nitrite: NEGATIVE
Protein, ur: NEGATIVE mg/dL
SPECIFIC GRAVITY, URINE: 1.005 (ref 1.005–1.030)
WBC, UA: NONE SEEN WBC/hpf (ref 0–5)
pH: 7 (ref 5.0–8.0)

## 2017-04-16 LAB — SEDIMENTATION RATE: SED RATE: 77 mm/h — AB (ref 0–20)

## 2017-04-16 MED ORDER — DEXTROSE 5 % IV SOLN
1.0000 g | Freq: Once | INTRAVENOUS | Status: AC
  Administered 2017-04-16: 1 g via INTRAVENOUS
  Filled 2017-04-16 (×2): qty 10

## 2017-04-16 MED ORDER — DOXYCYCLINE HYCLATE 100 MG PO CAPS: 100.0000 mg | ORAL_CAPSULE | Freq: Two times a day (BID) | ORAL | 0 refills | Status: DC

## 2017-04-16 MED ORDER — DOXYCYCLINE HYCLATE 100 MG PO TABS
200.0000 mg | ORAL_TABLET | Freq: Once | ORAL | Status: AC
  Administered 2017-04-16: 200 mg via ORAL
  Filled 2017-04-16: qty 2

## 2017-04-16 MED ORDER — PREDNISONE 20 MG PO TABS
40.0000 mg | ORAL_TABLET | ORAL | Status: AC
  Administered 2017-04-16: 40 mg via ORAL
  Filled 2017-04-16: qty 2

## 2017-04-16 MED ORDER — PREDNISONE 20 MG PO TABS: ORAL_TABLET | ORAL | 0 refills | Status: AC

## 2017-04-16 MED ORDER — CEFTRIAXONE SODIUM 1 G IJ SOLR: 1.0000 g | Freq: Once | INTRAMUSCULAR | Status: DC

## 2017-04-16 NOTE — ED Provider Notes (Addendum)
[facility] Emergency Department Provider Note  ____________________________________________  Time seen: Approximately 11:12 PM  I have reviewed the triage vital signs and the nursing notes.   HISTORY  Chief Complaint Rash    HPI Beth Strong is a 36 y.o. female who complains of a pruritic rash primarily over the lower extremities but also involving the left upper arm and now the lower abdomen. Denies any new exposures, no new medicines. Only takes Synthroid. No recent illness or current illness. No fevers chills sweats or weight changes. No sick family members recently, no outdoor activities or ticks or animal bites. No contacts with similar symptoms.  She was seen in the ED 4 days ago, given tramadol and hydroxyzine without relief. She notes that some lesions have faded and healed while newLesions have erupted along the same distribution as before.  She also notes right back of the ankle swelling that started today. No pain. No trauma.   Past Medical History:  Diagnosis Date  . Thyroid disease      There are no active problems to display for this patient.    History reviewed. No pertinent surgical history.   Prior to Admission medications   Medication Sig Start Date End Date Taking? Authorizing Provider  doxycycline (VIBRAMYCIN) 100 MG capsule Take 1 capsule (100 mg total) by mouth 2 (two) times daily. 04/16/17   Sharman CheekStafford, Dorian Renfro, MD  hydrOXYzine (ATARAX/VISTARIL) 50 MG tablet Take 1 tablet (50 mg total) by mouth 3 (three) times daily as needed. 04/12/17   Joni ReiningSmith, Ronald K, PA-C  methylPREDNISolone (MEDROL DOSEPAK) 4 MG TBPK tablet Take Tapered dose as directed 04/12/17   Joni ReiningSmith, Ronald K, PA-C  predniSONE (DELTASONE) 20 MG tablet Take 2 tablets (40 mg total) by mouth daily for 5 days, THEN 1 tablet (20 mg total) daily for 5 days, THEN 0.5 tablets (10 mg total) daily for 6 days. 04/16/17 05/02/17  Sharman CheekStafford, Colen Eltzroth, MD  traMADol (ULTRAM) 50 MG tablet Take  1 tablet (50 mg total) by mouth every 6 (six) hours as needed for moderate pain. 04/12/17   Joni ReiningSmith, Ronald K, PA-C     Allergies Patient has no known allergies.   Family History  Problem Relation Age of Onset  . Diabetes Mother     Social History Social History   Tobacco Use  . Smoking status: Never Smoker  . Smokeless tobacco: Never Used  Substance Use Topics  . Alcohol use: No  . Drug use: No    Review of Systems  Constitutional:   No fever or chills.  ENT:   No sore throat. No rhinorrhea. Cardiovascular:   No chest pain or syncope. Respiratory:   No dyspnea or cough. Gastrointestinal:   Negative for abdominal pain, vomiting and diarrhea.  Musculoskeletal:   Right ankle swelling All other systems reviewed and are negative except as documented above in ROS and HPI.  ____________________________________________   PHYSICAL EXAM:  VITAL SIGNS: ED Triage Vitals  Enc Vitals Group     BP 04/16/17 2114 114/88     Pulse Rate 04/16/17 2114 (!) 102     Resp 04/16/17 2114 17     Temp 04/16/17 2114 98 F (36.7 C)     Temp Source 04/16/17 2114 Oral     SpO2 04/16/17 2114 98 %     Weight 04/16/17 2114 155 lb (70.3 kg)     Height --      Head Circumference --      Peak Flow --  Pain Score 04/16/17 2113 5     Pain Loc --      Pain Edu? --      Excl. in GC? --     Vital signs reviewed, nursing assessments reviewed.   Constitutional:   Alert and oriented. Well appearing and in no distress. Eyes:   No scleral icterus.  EOMI. No nystagmus. No conjunctival pallor. PERRL. ENT   Head:   Normocephalic and atraumatic.   Nose:   No congestion/rhinnorhea.    Mouth/Throat:   MMM, no pharyngeal erythema. No peritonsillar mass.    Neck:   No meningismus. Full ROM. Hematological/Lymphatic/Immunilogical:   No cervical lymphadenopathy. Cardiovascular:   RRR, rate 85. Symmetric bilateral radial and DP pulses.  No murmurs.  Respiratory:   Normal respiratory effort  without tachypnea/retractions. Breath sounds are clear and equal bilaterally. No wheezes/rales/rhonchi. Gastrointestinal:   Soft and nontender. Non distended. There is no CVA tenderness.  No rebound, rigidity, or guarding. Genitourinary:   deferred Musculoskeletal:   Normal range of motion in all extremities. No joint effusions.  No lower extremity tenderness.  No edema. There is a small amount of swelling around the distal achilles tendon, but the tendon is taught, non-tender. No pain/tenderness at calc. Insertion sight. Intact tendon function with calf squeeze and passive dorsiflexion of foot. Neurologic:   Normal speech and language.  Motor grossly intact. No gross focal neurologic deficits are appreciated.  Skin:    Skin is warm, dry and intact. There is a nonblanching plaque eruption diffusely over the lower extremities with some involvement up to the lower abdomen as well as in the left upper arm. Lesions are approximately 5-6 mm but irregularly shaped. Not warm to the touch. Not tender. Older lesions appear to have healed and left slightly darkened spots.  No petechia or bullae  ____________________________________________    LABS (pertinent positives/negatives) (all labs ordered are listed, but only abnormal results are displayed) Labs Reviewed  CHLAMYDIA/NGC RT PCR ([facility] ONLY)  COMPREHENSIVE METABOLIC PANEL  CBC WITH DIFFERENTIAL/PLATELET  SEDIMENTATION RATE  URINALYSIS, COMPLETE (UACMP) WITH MICROSCOPIC   ____________________________________________   EKG    ____________________________________________    RADIOLOGY  No results found.  ____________________________________________   PROCEDURES Procedures  ____________________________________________   DIFFERENTIAL DIAGNOSIS  Infectious rash such as rickettsial disease, autoimmune or inflammatory disorder  CLINICAL IMPRESSION / ASSESSMENT AND PLAN / ED COURSE  Pertinent labs & imaging results that  were available during my care of the patient were reviewed by me and considered in my medical decision making (see chart for details).     Clinical Course as of Apr 17 1505  Thu Apr 16, 2017  2223 HR 85 on my exam. Diffuse rash, non blanching erythematous raised lesions. Vasculitis vs infectious but without any other focal sx or exam findings. Will check labs.   [PS]  2327 Assuming care from Dr. Scotty Court.  In short, Tauni Sanks is a 36 y.o. female with a chief complaint of rash.  Refer to the original H&P for additional details.  The current plan of care is to follow up labs and discharge if appropriate.   [CF]  2339 CMP is reassuring  [CF]  2339 UA is unremarkable, no evidence of infection  [CF]  Fri Apr 17, 2017  0005 Non-specific sed rate elevation in the setting of an acute rash, infectious vs inflammatory process.  Dr. Scotty Court treated empirically with abx, and the patient is non-toxic and stable.  Discharging as per his plan and recommendations. Sed Rate: Marland Kitchen)  77 [CF]  0024 Also as per Dr. Charmian MuffStafford's evaluation, Achilles' tendon is tender but intact.    [CF]    Clinical Course User Index [CF] Loleta RoseForbach, Cory, MD [PS] Sharman CheekStafford, Dorie Ohms, MD     ____________________________________________   FINAL CLINICAL IMPRESSION(S) / ED DIAGNOSES    Final diagnoses:  Rash and nonspecific skin eruption      This SmartLink is deprecated. Use AVSMEDLIST instead to display the medication list for a patient.   Portions of this note were generated with dragon dictation software. Dictation errors may occur despite best attempts at proofreading.    Sharman CheekStafford, Chenee Munns, MD 04/16/17 2318  Updated exam regarding MSK findings.     Sharman CheekStafford, Satara Virella, MD 04/17/17 [phone]

## 2017-04-16 NOTE — ED Notes (Signed)
Pt also states that her right foot hurts on her heel. It hurts to bend and flex. Pt states she did have relief with pain medication that was given from last visit. Pt states rash is uncomfortable.

## 2017-04-16 NOTE — ED Triage Notes (Signed)
Patient c/o rash. Patient reports being seen in ED Sunday for same. Patient reports she prescribed tramadol and hydroxyzine. Patient denies relief of symptoms.

## 2017-04-17 LAB — CHLAMYDIA/NGC RT PCR (ARMC ONLY)
Chlamydia Tr: NOT DETECTED
N GONORRHOEAE: NOT DETECTED

## 2017-04-17 NOTE — ED Provider Notes (Signed)
No results found.   Labs Reviewed  COMPREHENSIVE METABOLIC PANEL - Abnormal; Notable for the following components:      Result Value   Chloride 99 (*)    Glucose, Bld 116 (*)    Total Protein 8.5 (*)    All other components within normal limits  CBC WITH DIFFERENTIAL/PLATELET - Abnormal; Notable for the following components:   WBC 13.9 (*)    Neutro Abs 10.0 (*)    All other components within normal limits  SEDIMENTATION RATE - Abnormal; Notable for the following components:   Sed Rate 77 (*)    All other components within normal limits  URINALYSIS, COMPLETE (UACMP) WITH MICROSCOPIC - Abnormal; Notable for the following components:   Color, Urine COLORLESS (*)    APPearance CLEAR (*)    Hgb urine dipstick SMALL (*)    Squamous Epithelial / LPF 0-5 (*)    All other components within normal limits  CHLAMYDIA/NGC RT PCR [facility] ONLY)     Clinical Course as of Apr 17 25  Thu Apr 16, 2017  2223 HR 85 on my exam. Diffuse rash, non blanching erythematous raised lesions. Vasculitis vs infectious but without any other focal sx or exam findings. Will check labs.   [PS]  2327 Assuming care from Dr. Scotty CourtStafford.  In short, Georgeann OppenheimFariha Mumme is a 36 y.o. female with a chief complaint of rash.  Refer to the original H&P for additional details.  The current plan of care is to follow up labs and discharge if appropriate.   [CF]  2339 CMP is reassuring  [CF]  2339 UA is unremarkable, no evidence of infection  [CF]  Fri Apr 17, 2017  0005 Non-specific sed rate elevation in the setting of an acute rash, infectious vs inflammatory process.  Dr. Scotty CourtStafford treated empirically with abx, and the patient is non-toxic and stable.  Discharging as per his plan and recommendations. Sed Rate: (!) 77 [CF]  0024 Also as per Dr. Charmian MuffStafford's evaluation, Achilles' tendon is tender but intact.    [CF]    Clinical Course User Index [CF] Loleta RoseForbach, Kahlyn Shippey, MD [PS] Sharman CheekStafford, Phillip, MD     Final diagnoses:  Rash  and nonspecific skin eruption      Loleta RoseForbach, Evamaria Detore, MD 04/17/17 [phone]

## 2017-11-11 ENCOUNTER — Other Ambulatory Visit: Payer: Self-pay | Admitting: Family Medicine

## 2017-11-11 DIAGNOSIS — R300 Dysuria: Secondary | ICD-10-CM

## 2017-11-18 ENCOUNTER — Ambulatory Visit: Admission: RE | Admit: 2017-11-18 | Payer: Medicaid Other | Source: Ambulatory Visit

## 2017-11-24 ENCOUNTER — Ambulatory Visit: Payer: Medicaid Other | Attending: Family Medicine

## 2017-12-10 ENCOUNTER — Ambulatory Visit: Payer: Medicaid Other

## 2017-12-14 ENCOUNTER — Ambulatory Visit: Payer: BLUE CROSS/BLUE SHIELD

## 2017-12-25 ENCOUNTER — Ambulatory Visit: Payer: BLUE CROSS/BLUE SHIELD

## 2017-12-25 ENCOUNTER — Ambulatory Visit
Admission: RE | Admit: 2017-12-25 | Discharge: 2017-12-25 | Disposition: A | Discharge status: Home / Self Care | Payer: BLUE CROSS/BLUE SHIELD | Source: Ambulatory Visit | Attending: Family Medicine | Admitting: Family Medicine

## 2017-12-25 DIAGNOSIS — R1030 Lower abdominal pain, unspecified: Secondary | ICD-10-CM | POA: Insufficient documentation

## 2017-12-25 DIAGNOSIS — R300 Dysuria: Secondary | ICD-10-CM | POA: Diagnosis present

## 2017-12-28 ENCOUNTER — Ambulatory Visit: Payer: BLUE CROSS/BLUE SHIELD

## 2018-01-18 ENCOUNTER — Encounter: Payer: Self-pay | Admitting: Urology

## 2018-01-18 ENCOUNTER — Ambulatory Visit (INDEPENDENT_AMBULATORY_CARE_PROVIDER_SITE_OTHER): Payer: BLUE CROSS/BLUE SHIELD | Admitting: Urology

## 2018-01-18 ENCOUNTER — Telehealth: Payer: Self-pay | Admitting: Urology

## 2018-01-18 VITALS — BP 119/75 | HR 103 | Ht 62.0 in | Wt 160.0 lb

## 2018-01-18 DIAGNOSIS — R300 Dysuria: Secondary | ICD-10-CM | POA: Diagnosis not present

## 2018-01-18 LAB — BLADDER SCAN AMB NON-IMAGING: Scan Result: 17

## 2018-01-18 NOTE — Progress Notes (Signed)
01/18/2018 4:36 PM   Georgeann Oppenheim March 18, 1981 [id number]  Referring provider: Emogene Morgan, MD [address] [place], [place] [address]  Chief Complaint  Patient presents with  . Dysuria    HPI: Patient is a 37 year old [place] female who has had persistent suprapubic burning after urination since April referred by Dr. Karie Fetch.    She states that she was on a course of prednisone for URI and then when she completed the course of prednisone,  her urinary symptoms began.  She states that after she urinates she has a burning sensation from her mid abdomen into her suprapubic region.  This burning sensation can last from half hour to an hour after urination.  She states that she has been on 3 antibiotics, but she has not had any relief of this burning.  She states that all the cultures done at her PCP's office have been negative.  Reviewed records and GC/Chlamydia were negative, wet mount negative for trich and BV and urine culture negative.    Patient denies any gross hematuria, dysuria or suprapubic/flank pain.  Patient denies any fevers, chills, nausea or vomiting.   She is also given a trial of amitriptyline, but she did not find it effective.  CT Renal stone study on 12/25/2017 revealed no urinary tract calculi or hydronephrosis.  No explanation for patient's symptoms.  One cup of tea in the am and one in the night, otherwise just water.   Her UA was negative.       PMH: Past Medical History:  Diagnosis Date  . Thyroid disease     Surgical History: No past surgical history on file.  Home Medications:  Allergies as of 01/18/2018   No Known Allergies     Medication List        Accurate as of 01/18/18 11:59 PM. Always use your most recent med list.          levothyroxine 100 MCG tablet Commonly known as:  SYNTHROID, LEVOTHROID Take 100 mcg by mouth daily before breakfast.   pantoprazole 40 MG tablet Commonly known as:  PROTONIX Take 40 mg by  mouth daily.       Allergies: No Known Allergies  Family History: Family History  Problem Relation Age of Onset  . Diabetes Mother     Social History:  reports that she has never smoked. She has never used smokeless tobacco. She reports that she does not drink alcohol or use drugs.  ROS: UROLOGY Frequent Urination?: Yes Hard to postpone urination?: No Burning/pain with urination?: No Get up at night to urinate?: No Leakage of urine?: No Urine stream starts and stops?: No Trouble starting stream?: No Do you have to strain to urinate?: No Blood in urine?: No Urinary tract infection?: Yes Sexually transmitted disease?: No Injury to kidneys or bladder?: No Painful intercourse?: No Weak stream?: No Currently pregnant?: No Vaginal bleeding?: No Last menstrual period?: n  Gastrointestinal Nausea?: Yes Vomiting?: No Indigestion/heartburn?: Yes Diarrhea?: No Constipation?: No  Constitutional Fever: No Night sweats?: No Weight loss?: No Fatigue?: No  Skin Skin rash/lesions?: No Itching?: No  Eyes Blurred vision?: No Double vision?: No  Ears/Nose/Throat Sore throat?: No Sinus problems?: No  Hematologic/Lymphatic Swollen glands?: No Easy bruising?: No  Cardiovascular Leg swelling?: No Chest pain?: No  Respiratory Cough?: No Shortness of breath?: No  Endocrine Excessive thirst?: No  Musculoskeletal Back pain?: No Joint pain?: No  Neurological Headaches?: Yes Dizziness?: No  Psychologic Depression?: Yes Anxiety?: No  Physical Exam: BP 119/75   Pulse (!) 103   Ht 5\' 2"  (1.575 m)   Wt 160 lb (72.6 kg)   BMI 29.26 kg/m   Constitutional:  Well nourished. Alert and oriented, No acute distress. HEENT: [place] AT, moist mucus membranes.  Trachea midline, no masses. Cardiovascular: No clubbing, cyanosis, or edema. Respiratory: Normal respiratory effort, no increased work of breathing. GI: Abdomen is soft, non tender, non distended, no abdominal  masses. Liver and spleen not palpable.  No hernias appreciated.  Stool sample for occult testing is not indicated.   GU: No CVA tenderness.  No bladder fullness or masses.  Normal external genitalia, normal pubic hair distribution, no lesions.  Thin coating of sebum on the exterior labial folds.  Normal urethral meatus, no lesions, no prolapse, no discharge.   No urethral masses, tenderness and/or tenderness. Bladder tender on palpation.  Normal vagina mucosa, good estrogen effect, no discharge, no lesions, good pelvic support, no cystocele or rectocele noted.  No cervical motion tenderness.  Uterus is freely mobile and non-fixed.  No adnexal/parametria masses or tenderness noted.  Anus and perineum are without rashes or lesions.    Skin: No rashes, bruises or suspicious lesions. Lymph: No cervical or inguinal adenopathy. Neurologic: Grossly intact, no focal deficits, moving all 4 extremities. Psychiatric: Normal mood and affect.  Laboratory Data: Lab Results  Component Value Date   WBC 13.9 (H) 04/16/2017   HGB 12.1 04/16/2017   HCT 35.9 04/16/2017   MCV 83.5 04/16/2017   PLT 339 04/16/2017    Lab Results  Component Value Date   CREATININE 0.54 04/16/2017    No results found for: PSA  No results found for: TESTOSTERONE  No results found for: HGBA1C  No results found for: TSH  No results found for: CHOL, HDL, CHOLHDL, VLDL, LDLCALC  Lab Results  Component Value Date   AST 17 04/16/2017   Lab Results  Component Value Date   ALT 14 04/16/2017   No components found for: ALKALINEPHOPHATASE No components found for: BILIRUBINTOTAL  No results found for: ESTRADIOL  Urinalysis Negative.  See Epic. I have reviewed the labs.   Pertinent Imaging: CLINICAL DATA:  Pelvic pain. Burning and pressure since April. Dysuria. Status post 3 rounds of antibiotics.  EXAM: CT ABDOMEN AND PELVIS WITHOUT CONTRAST  TECHNIQUE: Multidetector CT imaging of the abdomen and pelvis was  performed following the standard protocol without IV contrast.  COMPARISON:  None.  FINDINGS: Lower chest: Clear lung bases. Normal heart size without pericardial or pleural effusion.  Hepatobiliary: Normal liver. Normal gallbladder, without biliary ductal dilatation.  Pancreas: Normal, without mass or ductal dilatation.  Spleen: Normal in size, without focal abnormality.  Adrenals/Urinary Tract: Normal adrenal glands. No renal calculi or hydronephrosis. No hydroureter or ureteric calculi. No bladder calculi.  Stomach/Bowel: Normal stomach, without wall thickening. Normal colon, appendix, and terminal ileum. Normal small bowel.  Vascular/Lymphatic: Normal caliber of the aorta and branch vessels. No abdominopelvic adenopathy.  Reproductive: Normal uterus and adnexa.  Other: No significant free fluid.  Musculoskeletal: Presumed degenerative sclerosis of the bilateral sacroiliac joints.  IMPRESSION: 1.  No urinary tract calculi or hydronephrosis. 2. No explanation for patient's symptoms.   Electronically Signed   By: Jeronimo Greaves M.D.   On: 12/26/2017 12:47 I have independently reviewed the films.    Assessment & Plan:    1. Dysuria Occurs after urination and lasts for several minutes to hours  UA is negative, but will send for culture for  completeness  Also sent GC/chlamydia  Will schedule cystoscopy ? IC vs CIS - no outside [place] travel for the last seven years  If cystoscopy is negative, will consider referral to PT  Return for cystoscopy for dysuria .  These notes generated with voice recognition software. I apologize for typographical errors.  Michiel Cowboy, PA-C  [facility] [address]  [address] [place], [place] [address] [phone]

## 2018-01-18 NOTE — Telephone Encounter (Signed)
Would you call Beth Strong and get her urine culture results and any STI culture results?

## 2018-01-19 ENCOUNTER — Telehealth: Payer: Self-pay | Admitting: Urology

## 2018-01-19 ENCOUNTER — Other Ambulatory Visit: Payer: Self-pay

## 2018-01-19 LAB — MICROSCOPIC EXAMINATION: WBC UA: NONE SEEN /HPF (ref 0–5)

## 2018-01-19 LAB — URINALYSIS, COMPLETE
BILIRUBIN UA: NEGATIVE
Glucose, UA: NEGATIVE
KETONES UA: NEGATIVE
Leukocytes, UA: NEGATIVE
NITRITE UA: NEGATIVE
PROTEIN UA: NEGATIVE
Specific Gravity, UA: 1.015 (ref 1.005–1.030)
UUROB: 0.2 mg/dL (ref 0.2–1.0)
pH, UA: 6.5 (ref 5.0–7.5)

## 2018-01-19 LAB — GC/CHLAMYDIA PROBE AMP
CHLAMYDIA, DNA PROBE: NEGATIVE
Neisseria gonorrhoeae by PCR: NEGATIVE

## 2018-01-19 NOTE — Telephone Encounter (Signed)
Called and labs will be faxed

## 2018-01-19 NOTE — Telephone Encounter (Signed)
Would you ask Mrs. Forner if she has traveled to [place] in the last year?

## 2018-01-20 LAB — CULTURE, URINE COMPREHENSIVE

## 2018-01-20 NOTE — Telephone Encounter (Signed)
Called pt she states that she has not traveled outside the country in the last year. States she has been in the [place] for 7 years.

## 2018-02-17 ENCOUNTER — Ambulatory Visit (INDEPENDENT_AMBULATORY_CARE_PROVIDER_SITE_OTHER): Payer: BLUE CROSS/BLUE SHIELD | Admitting: Urology

## 2018-02-17 ENCOUNTER — Encounter: Payer: Self-pay | Admitting: Urology

## 2018-02-17 VITALS — BP 110/66 | HR 83 | Ht 62.0 in | Wt 170.0 lb

## 2018-02-17 DIAGNOSIS — R300 Dysuria: Secondary | ICD-10-CM

## 2018-02-17 LAB — URINALYSIS, COMPLETE
Bilirubin, UA: NEGATIVE
Glucose, UA: NEGATIVE
Ketones, UA: NEGATIVE
NITRITE UA: NEGATIVE
PH UA: 6 (ref 5.0–7.5)
Protein, UA: NEGATIVE
Specific Gravity, UA: 1.02 (ref 1.005–1.030)
Urobilinogen, Ur: 0.2 mg/dL (ref 0.2–1.0)

## 2018-02-17 LAB — MICROSCOPIC EXAMINATION

## 2018-02-17 MED ORDER — LIDOCAINE HCL URETHRAL/MUCOSAL 2 % EX GEL: 1.0000 "application " | Freq: Once | CUTANEOUS | Status: AC

## 2018-02-17 NOTE — Progress Notes (Signed)
   02/17/18  CC:  Chief Complaint  Patient presents with  . Cysto    HPI: 37 year old female with persistent dysuria despite treatment with antibiotics who presents today for cystoscopy to rule out underlying bladder pathology.  She does report today that her symptoms are slowly resolving.  She notes that her symptoms are exacerbated by spicy foods which she has been avoiding.  Blood pressure 110/66, pulse 83, height 5\' 2"  (1.575 m), weight 170 lb (77.1 kg). NED. A&Ox3.   No respiratory distress   Abd soft, NT, ND Normal external genitalia with patent urethral meatus  Cystoscopy Procedure Note  Patient identification was confirmed, informed consent was obtained, and patient was prepped using Betadine solution.  Lidocaine jelly was administered per urethral meatus.    Procedure: - Flexible cystoscope introduced, without any difficulty.   - Thorough search of the bladder revealed:    normal urethral meatus    normal urothelium    no stones    no ulcers     no tumors    no urethral polyps    no trabeculation  - Ureteral orifices were normal in position and appearance.  Post-Procedure: - Patient tolerated the procedure well  Assessment/ Plan:  1. Dysuria Cystoscopy today is unremarkable Given that her symptoms seem to be exacerbated with spicy foods, we discussed that she may perhaps have an underlying condition such as interstitial cystitis She was given a handout today on foods to avoid if in fact she has this condition She will follow-up as needed if her symptoms recur - Urinalysis, Complete - lidocaine (XYLOCAINE) 2 % jelly 1 application    Return if symptoms worsen or fail to improve.  Vanna Scotland, MD

## 2019-06-10 ENCOUNTER — Ambulatory Visit: Payer: BLUE CROSS/BLUE SHIELD

## 2019-06-16 ENCOUNTER — Other Ambulatory Visit: Payer: Self-pay

## 2019-06-16 ENCOUNTER — Ambulatory Visit: Payer: Medicaid Other

## 2019-08-19 ENCOUNTER — Ambulatory Visit: Payer: Medicaid Other | Attending: Internal Medicine

## 2019-08-19 DIAGNOSIS — Z23 Encounter for immunization: Secondary | ICD-10-CM

## 2019-08-19 NOTE — Progress Notes (Signed)
   Covid-19 Vaccination Clinic  Name:  Siboney Requejo    MRN: [id number] DOB: 22-Jun-1980  08/19/2019  Ms. Dantes was observed post Covid-19 immunization for 15 minutes without incident. She was provided with Vaccine Information Sheet and instruction to access the V-Safe system.   Ms. Keleher was instructed to call 911 with any severe reactions post vaccine: Marland Kitchen Difficulty breathing  . Swelling of face and throat  . A fast heartbeat  . A bad rash all over body  . Dizziness and weakness   Immunizations Administered    Name Date Dose VIS Date Route   Pfizer COVID-19 Vaccine 08/19/2019  5:40 PM 0.3 mL 04/22/2019 Intramuscular   Manufacturer: [employer], [employer]   Lot: [id number]   NDC: [address]-6

## 2019-08-20 ENCOUNTER — Ambulatory Visit: Payer: BLUE CROSS/BLUE SHIELD

## 2019-08-23 ENCOUNTER — Telehealth: Payer: Self-pay | Admitting: Obstetrics and Gynecology

## 2019-08-23 NOTE — Telephone Encounter (Signed)
error 

## 2019-08-29 ENCOUNTER — Telehealth: Payer: Self-pay

## 2019-08-29 NOTE — Telephone Encounter (Signed)
I LVM stating I have received [facility] Financial Assistance application. Analis has not yet been seen by a provider at [facility]. This appt is on 4/22 at 630pm. At that time, if PCP determines patient needs a referral to a [facility] then we will send CAFA for processing.

## 2019-09-01 ENCOUNTER — Ambulatory Visit: Payer: Medicaid Other

## 2019-09-05 IMAGING — CT CT RENAL STONE PROTOCOL
2 of 4 series · 16 of 46 positions shown, 18 images · non-contrast
Comparison: None.

CLINICAL DATA: Pelvic pain. Burning and pressure since [place].
Dysuria. Status post 3 rounds of antibiotics.

EXAM:
CT ABDOMEN AND PELVIS WITHOUT CONTRAST
TECHNIQUE: Multidetector CT imaging of the abdomen and pelvis was performed
following the standard protocol without IV contrast.

[Series 2: renal stone · axial · 0.62mm/px · z∈[-1500,-1120]mm · 13 of 87 slices shown, 15 images (1 of 2)]
[im 7/87  soft-tissue]
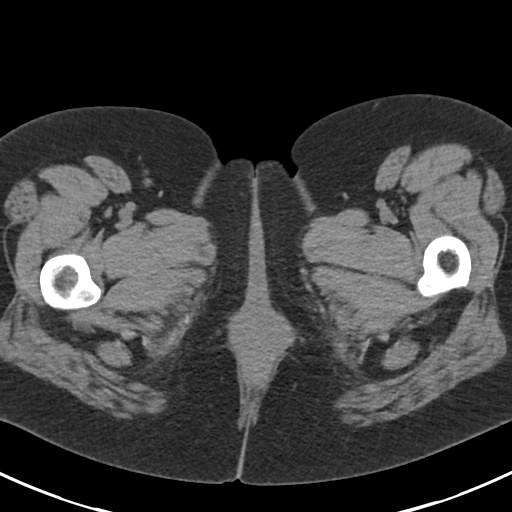
[im 7/87  bone]
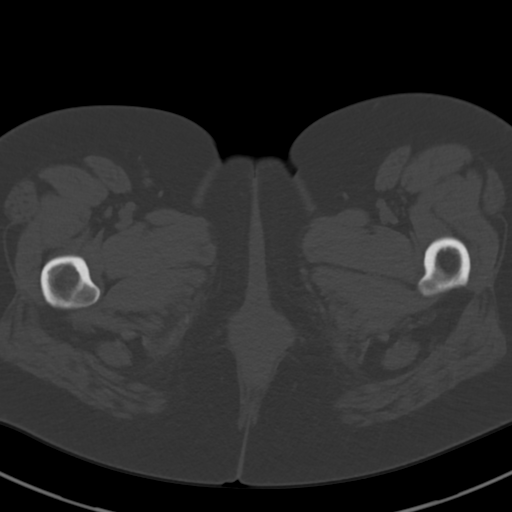
[im 13/87  soft-tissue]
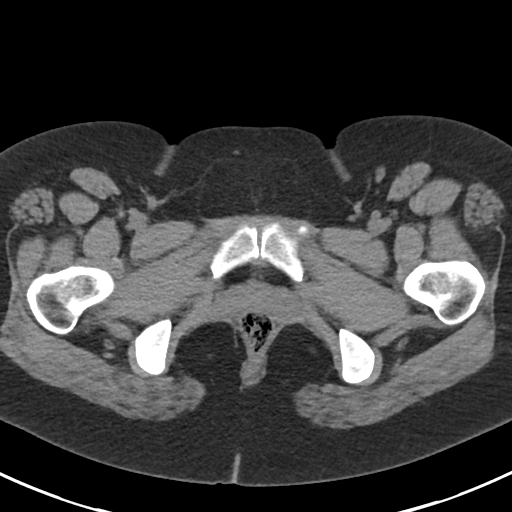
[im 20/87  soft-tissue]
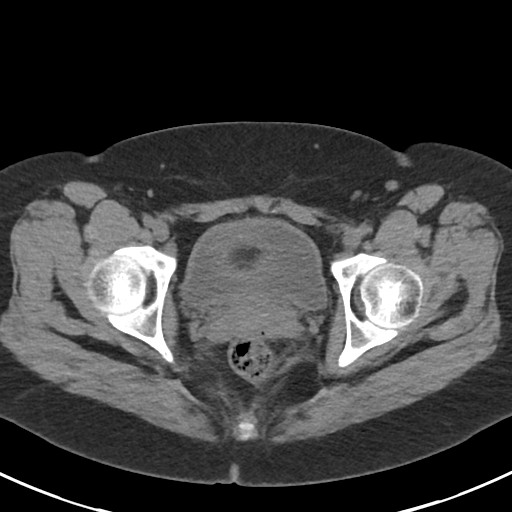
[im 26/87  soft-tissue]
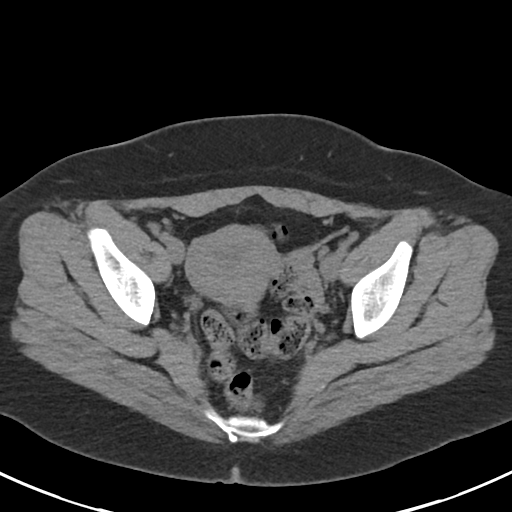
[im 32/87  soft-tissue]
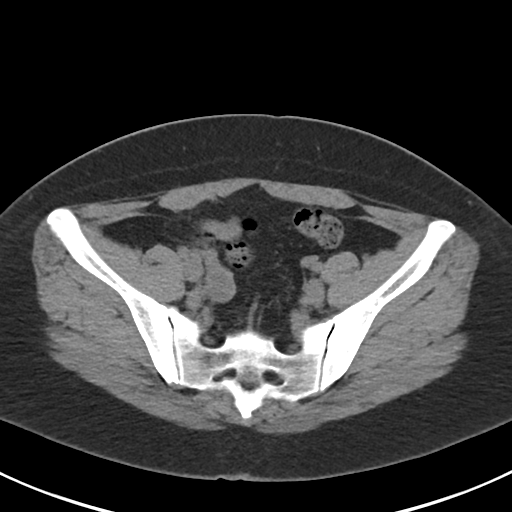
[im 39/87  soft-tissue]
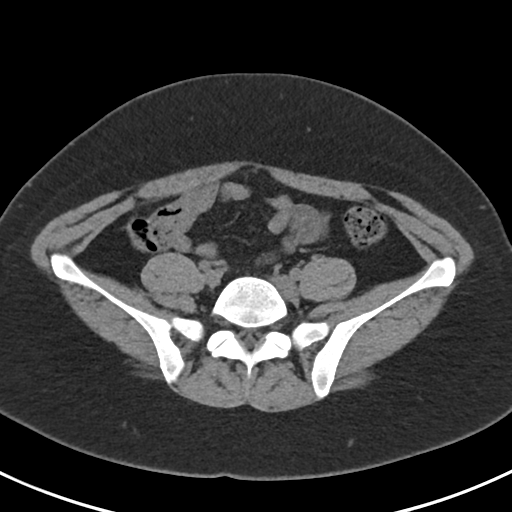
[im 45/87  soft-tissue]
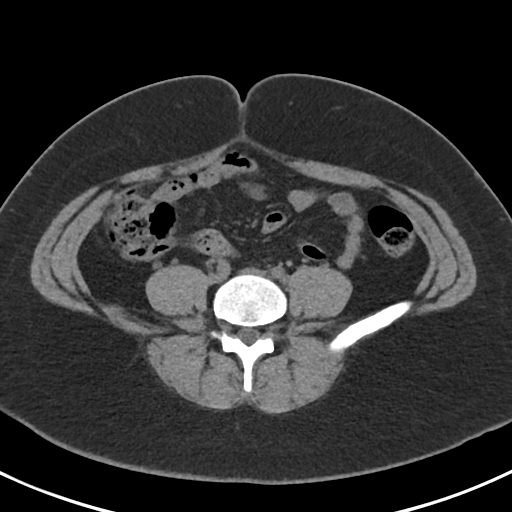
[im 51/87  soft-tissue]
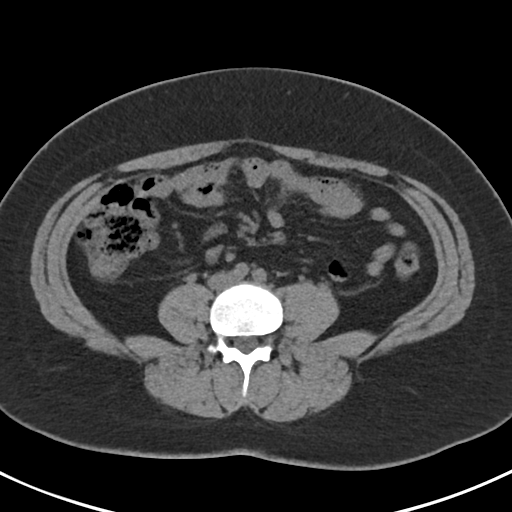
[im 58/87  soft-tissue]
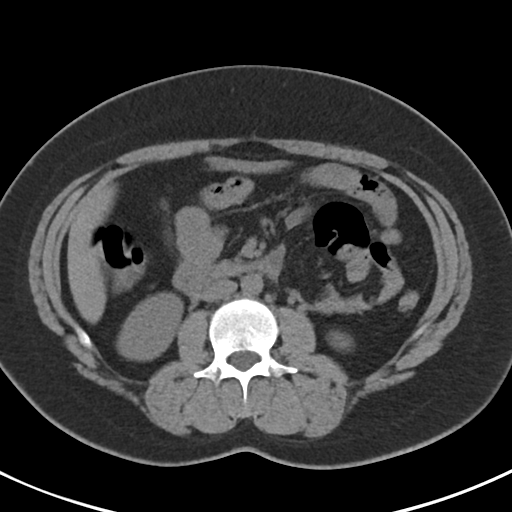
[im 58/87  bone]
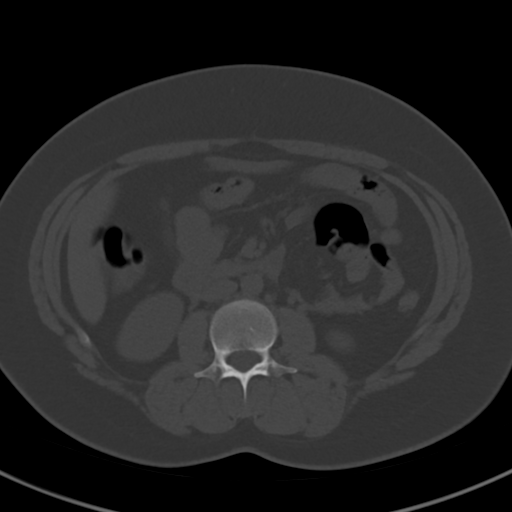
[im 64/87  soft-tissue]
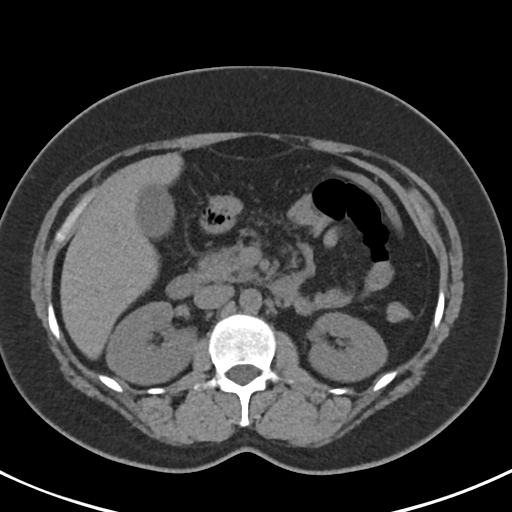
[im 71/87  soft-tissue]
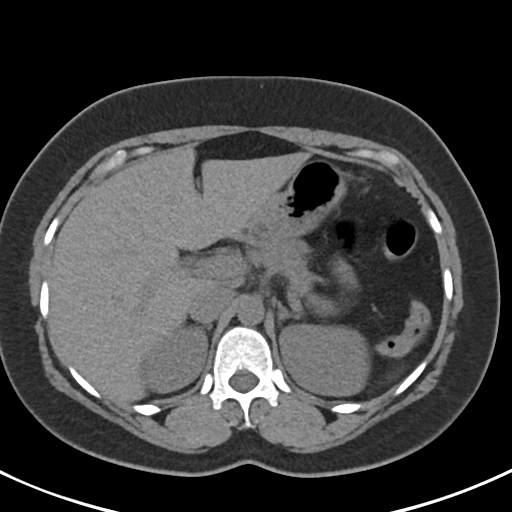
[im 77/87  soft-tissue]
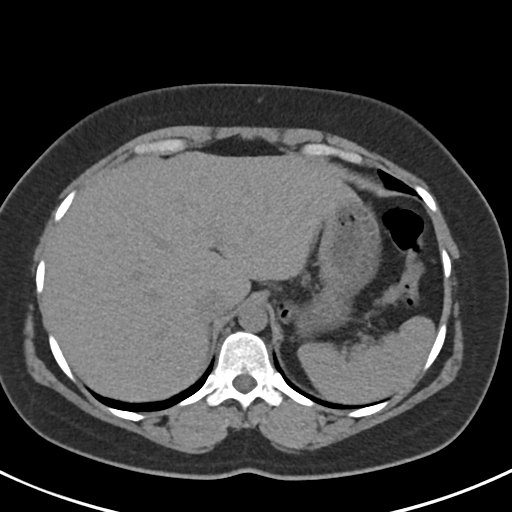
[im 83/87  soft-tissue]
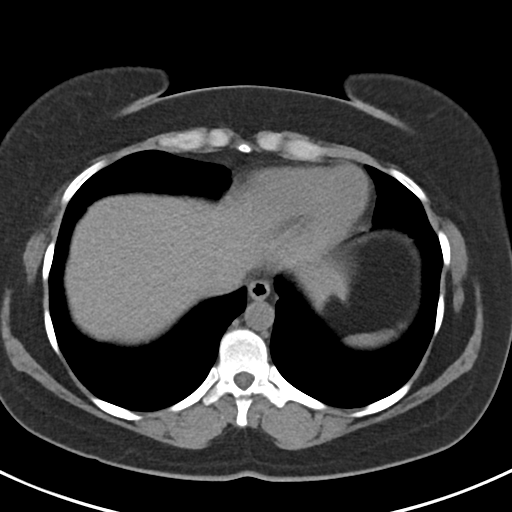

[Series 4: renal stone · coronal · 0.62mm/px · 3 of 137 slices shown (2 of 2)]
[im 46/137  soft-tissue]
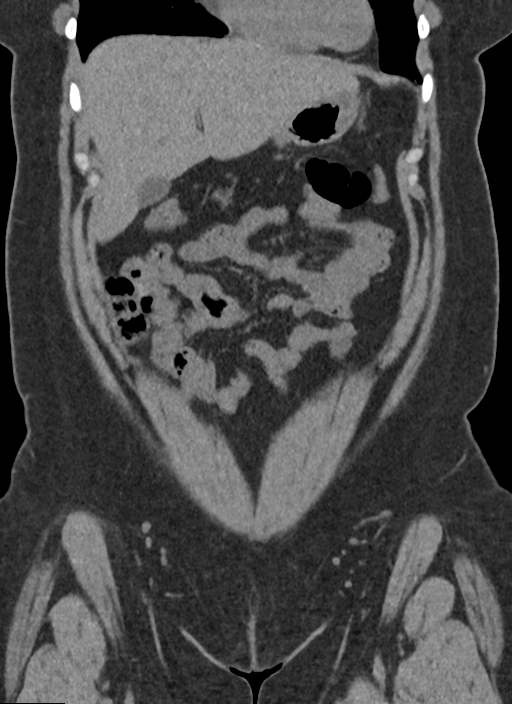
[im 61/137  soft-tissue]
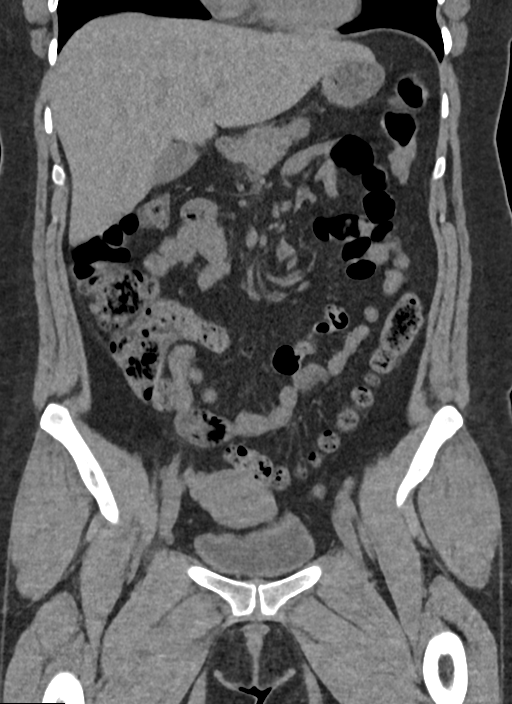
[im 76/137  soft-tissue]
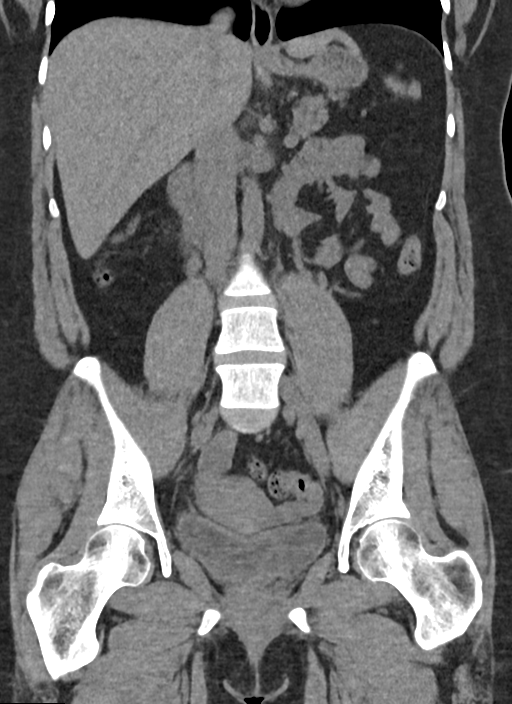

[16 of 46 positions shown; findings below may reference images not displayed]

FINDINGS: Lower chest: Clear lung bases. Normal heart size without pericardial
or pleural effusion.

Hepatobiliary: Normal liver. Normal gallbladder, without biliary
ductal dilatation.

Pancreas: Normal, without mass or ductal dilatation.

Spleen: Normal in size, without focal abnormality.

Adrenals/Urinary Tract: Normal adrenal glands. No renal calculi or
hydronephrosis. No hydroureter or ureteric calculi. No bladder
calculi.

Stomach/Bowel: Normal stomach, without wall thickening. Normal
colon, appendix, and terminal ileum. Normal small bowel.

Vascular/Lymphatic: Normal caliber of the aorta and branch vessels.
No abdominopelvic adenopathy.

Reproductive: Normal uterus and adnexa.

Other: No significant free fluid.

Musculoskeletal: Presumed degenerative sclerosis of the bilateral
sacroiliac joints.
IMPRESSION: 1.  No urinary tract calculi or hydronephrosis.
2. No explanation for patient's symptoms.

## 2019-09-08 ENCOUNTER — Telehealth: Payer: Self-pay

## 2019-09-08 NOTE — Telephone Encounter (Signed)
Patient cancelled new patient appt that was scheduled for 09/01/19.  Can't process CAFA until seen by provider and referral has been sent. For now, CAFA paperwork has been filed away and will need to be updated after 30 days.

## 2019-09-13 ENCOUNTER — Ambulatory Visit: Payer: Medicaid Other | Attending: Internal Medicine

## 2019-09-13 DIAGNOSIS — Z23 Encounter for immunization: Secondary | ICD-10-CM

## 2019-09-13 NOTE — Progress Notes (Signed)
   Covid-19 Vaccination Clinic  Name:  Beth Strong    MRN: [id number] DOB: 1980-11-30  09/13/2019  Beth Strong was observed post Covid-19 immunization for 15 minutes without incident. She was provided with Vaccine Information Sheet and instruction to access the V-Safe system.   Beth Strong was instructed to call 911 with any severe reactions post vaccine: Marland Kitchen Difficulty breathing  . Swelling of face and throat  . A fast heartbeat  . A bad rash all over body  . Dizziness and weakness   Immunizations Administered    Name Date Dose VIS Date Route   Pfizer COVID-19 Vaccine 09/13/2019  4:15 PM 0.3 mL 07/06/2018 Intramuscular   Manufacturer: [employer], [employer]   Lot: [id number]   NDC: [address]-8

## 2019-09-14 ENCOUNTER — Encounter: Payer: Self-pay | Admitting: Emergency Medicine

## 2019-09-14 ENCOUNTER — Emergency Department
Admission: EM | Admit: 2019-09-14 | Discharge: 2019-09-14 | Disposition: A | Discharge status: Home / Self Care | Payer: Medicaid Other | Attending: Emergency Medicine | Admitting: Emergency Medicine

## 2019-09-14 ENCOUNTER — Emergency Department: Payer: Medicaid Other

## 2019-09-14 ENCOUNTER — Other Ambulatory Visit: Payer: Self-pay

## 2019-09-14 DIAGNOSIS — R531 Weakness: Secondary | ICD-10-CM | POA: Diagnosis not present

## 2019-09-14 DIAGNOSIS — R102 Pelvic and perineal pain: Secondary | ICD-10-CM | POA: Insufficient documentation

## 2019-09-14 DIAGNOSIS — O99280 Endocrine, nutritional and metabolic diseases complicating pregnancy, unspecified trimester: Secondary | ICD-10-CM | POA: Diagnosis not present

## 2019-09-14 DIAGNOSIS — R112 Nausea with vomiting, unspecified: Secondary | ICD-10-CM | POA: Insufficient documentation

## 2019-09-14 DIAGNOSIS — R197 Diarrhea, unspecified: Secondary | ICD-10-CM | POA: Diagnosis not present

## 2019-09-14 DIAGNOSIS — E039 Hypothyroidism, unspecified: Secondary | ICD-10-CM | POA: Diagnosis not present

## 2019-09-14 DIAGNOSIS — Z79899 Other long term (current) drug therapy: Secondary | ICD-10-CM | POA: Diagnosis not present

## 2019-09-14 DIAGNOSIS — T50Z95A Adverse effect of other vaccines and biological substances, initial encounter: Secondary | ICD-10-CM | POA: Diagnosis not present

## 2019-09-14 DIAGNOSIS — R109 Unspecified abdominal pain: Secondary | ICD-10-CM | POA: Insufficient documentation

## 2019-09-14 DIAGNOSIS — O26899 Other specified pregnancy related conditions, unspecified trimester: Secondary | ICD-10-CM | POA: Diagnosis present

## 2019-09-14 DIAGNOSIS — Z3A12 12 weeks gestation of pregnancy: Secondary | ICD-10-CM | POA: Diagnosis not present

## 2019-09-14 LAB — CBC
HCT: 36.6 % (ref 36.0–46.0)
Hemoglobin: 12.7 g/dL (ref 12.0–15.0)
MCH: 28.6 pg (ref 26.0–34.0)
MCHC: 34.7 g/dL (ref 30.0–36.0)
MCV: 82.4 fL (ref 80.0–100.0)
Platelets: 297 10*3/uL (ref 150–400)
RBC: 4.44 MIL/uL (ref 3.87–5.11)
RDW: 13.9 % (ref 11.5–15.5)
WBC: 9 10*3/uL (ref 4.0–10.5)
nRBC: 0 % (ref 0.0–0.2)

## 2019-09-14 LAB — COMPREHENSIVE METABOLIC PANEL
ALT: 15 U/L (ref 0–44)
AST: 16 U/L (ref 15–41)
Albumin: 3.8 g/dL (ref 3.5–5.0)
Alkaline Phosphatase: 41 U/L (ref 38–126)
Anion gap: 9 (ref 5–15)
BUN: 7 mg/dL (ref 6–20)
CO2: 21 mmol/L — ABNORMAL LOW (ref 22–32)
Calcium: 8.7 mg/dL — ABNORMAL LOW (ref 8.9–10.3)
Chloride: 102 mmol/L (ref 98–111)
Creatinine, Ser: 0.43 mg/dL — ABNORMAL LOW (ref 0.44–1.00)
GFR calc Af Amer: >60 mL/min (ref >60)
GFR calc non Af Amer: >60 mL/min (ref >60)
Glucose, Bld: 97 mg/dL (ref 70–99)
Potassium: 3 mmol/L — ABNORMAL LOW (ref 3.5–5.1)
Sodium: 132 mmol/L — ABNORMAL LOW (ref 135–145)
Total Bilirubin: 0.8 mg/dL (ref 0.3–1.2)
Total Protein: 7.8 g/dL (ref 6.5–8.1)

## 2019-09-14 LAB — LIPASE, BLOOD: Lipase: 21 U/L (ref 11–51)

## 2019-09-14 LAB — HCG, QUANTITATIVE, PREGNANCY: hCG, Beta Chain, Quant, S: 122460 m[IU]/mL — ABNORMAL HIGH (ref <5)

## 2019-09-14 MED ORDER — POTASSIUM CHLORIDE ER 10 MEQ PO TBCR: 10.0000 meq | EXTENDED_RELEASE_TABLET | Freq: Two times a day (BID) | ORAL | 0 refills | Status: AC

## 2019-09-14 NOTE — Discharge Instructions (Signed)
Take the potassium pill as prescribed, as your potassium level is somewhat low.  You may continue to use your normal nausea medicine at home.  Return to the ER for new, worsening, or persistent severe nausea or vomiting, diarrhea, blood in the stool, vaginal bleeding, abdominal pain, fever, or any other new or worsening symptoms that concern you.

## 2019-09-14 NOTE — ED Provider Notes (Signed)
[facility] Emergency Department Provider Note ____________________________________________   First MD Initiated Contact with Patient 09/14/19 2056     (approximate)  I have reviewed the triage vital signs and the nursing notes.   HISTORY  Chief Complaint Abdominal Pain    HPI Beth Strong is a 39 y.o. female G4P3 at 71 weeks with PMH as noted below who presents with nausea, vomiting, diarrhea, acute onset earlier today, and now resolved.  The patient reports associated diffuse crampy abdominal pain which has also now subsided.  She had a second dose of the Pfizer COVID-19 vaccination yesterday afternoon.  She denies any fever chills or respiratory symptoms.  Past Medical History:  Diagnosis Date  . Thyroid disease     Patient Active Problem List   Diagnosis Date Noted  . Depression, postpartum 03/30/2014  . GERD (gastroesophageal reflux disease) 03/30/2014  . Hypothyroidism, unspecified 03/30/2014  . Supervision of normal pregnancy 03/30/2014    History reviewed. No pertinent surgical history.  Prior to Admission medications   Medication Sig Start Date End Date Taking? Authorizing Provider  levothyroxine (SYNTHROID, LEVOTHROID) 100 MCG tablet Take 100 mcg by mouth daily before breakfast.    [provider]  pantoprazole (PROTONIX) 40 MG tablet Take 40 mg by mouth daily.    [provider]  potassium chloride (KLOR-CON) 10 MEQ tablet Take 1 tablet (10 mEq total) by mouth 2 (two) times daily for 7 days. 09/14/19 09/21/19  Dionne Bucy, MD    Allergies Patient has no known allergies.  Family History  Problem Relation Age of Onset  . Diabetes Mother     Social History Social History   Tobacco Use  . Smoking status: Never Smoker  . Smokeless tobacco: Never Used  Substance Use Topics  . Alcohol use: No  . Drug use: No    Review of Systems  Constitutional: No fever/chills. Eyes: No redness. ENT: No sore  throat. Cardiovascular: Denies chest pain. Respiratory: Denies shortness of breath. Gastrointestinal: Positive for resolved vomiting and diarrhea.  Genitourinary: Negative for dysuria or vaginal bleeding.  Musculoskeletal: Negative for back pain. Skin: Negative for rash. Neurological: Negative for headache.   ____________________________________________   PHYSICAL EXAM:  VITAL SIGNS: ED Triage Vitals  Enc Vitals Group     BP 09/14/19 1752 97/61     Pulse Rate 09/14/19 1752 83     Resp 09/14/19 1752 18     Temp 09/14/19 1752 97.6 F (36.4 C)     Temp Source 09/14/19 1752 Oral     SpO2 09/14/19 1752 99 %     Weight 09/14/19 1749 164 lb (74.4 kg)     Height 09/14/19 1749 5\' 2"  (1.575 m)     Head Circumference --      Peak Flow --      Pain Score 09/14/19 1749 8     Pain Loc --      Pain Edu? --      Excl. in GC? --     Constitutional: Alert and oriented. Well appearing and in no acute distress. Eyes: Conjunctivae are normal.  Head: Atraumatic. Nose: No congestion/rhinnorhea. Mouth/Throat: Mucous membranes are moist.   Neck: Normal range of motion.  Cardiovascular: Normal rate, regular rhythm. Good peripheral circulation. Respiratory: Normal respiratory effort.  No retractions.  Gastrointestinal: Soft and nontender. No distention.  Genitourinary: No CVA tenderness. Musculoskeletal: Extremities warm and well perfused.  Neurologic:  Normal speech and language. No gross focal neurologic deficits are appreciated.  Skin:  Skin is warm and dry. No rash noted. Psychiatric: Mood and affect are normal. Speech and behavior are normal.  ____________________________________________   LABS (all labs ordered are listed, but only abnormal results are displayed)  Labs Reviewed  COMPREHENSIVE METABOLIC PANEL - Abnormal; Notable for the following components:      Result Value   Sodium 132 (*)    Potassium 3.0 (*)    CO2 21 (*)    Creatinine, Ser 0.43 (*)    Calcium 8.7 (*)     All other components within normal limits  HCG, QUANTITATIVE, PREGNANCY - Abnormal; Notable for the following components:   hCG, Beta Chain, Quant, S 122,460 (*)    All other components within normal limits  LIPASE, BLOOD  CBC  URINALYSIS, COMPLETE (UACMP) WITH MICROSCOPIC  POC URINE PREG, ED   ____________________________________________  EKG   ____________________________________________  RADIOLOGY  US OB: Single live IUP with FHR 162.  Estimated gestational age [redacted] weeks 4 days.  ____________________________________________   PROCEDURES  Procedure(s) performed: No  Procedures  Critical Care performed: No ____________________________________________   INITIAL IMPRESSION / ASSESSMENT AND PLAN / ED COURSE  Pertinent labs & imaging results that were available during my care of the patient were reviewed by me and considered in my medical decision making (see chart for details).  39 year old female G4, P3 at 12 weeks by dates presents with nausea, vomiting, and diarrhea as well as some crampy abdominal pain today after getting the second dose of the [place] COVID-19 vaccination yesterday.  The symptoms have now mostly subsided and she has not vomited in more than 6 hours.  She states she is feeling significantly better.  On exam the patient is well-appearing.  Her vital signs are normal except for borderline low BP at triage.  The abdomen is soft and nontender.  Lab work-up was obtained from triage and shows no significant abnormalities other than borderline hypokalemia which I will treat.  Ultrasound shows a live IUP with normal FHR.  Overall I suspect that the patient's GI symptoms were side effect of the vaccination/immune response.  Differential also includes gastroenteritis or other benign etiology.  At this time, the patient feels comfortable and would like to go home.  I counseled her on the results of the work-up.  She is stable for discharge.  Return precautions  given, and she expresses understanding.  I will prescribe a 1 week course of potassium repletion.   ____________________________________________   FINAL CLINICAL IMPRESSION(S) / ED DIAGNOSES  Final diagnoses:  Nausea vomiting and diarrhea  Side effects of vaccination, initial encounter      NEW MEDICATIONS STARTED DURING THIS VISIT:  New Prescriptions   POTASSIUM CHLORIDE (KLOR-CON) 10 MEQ TABLET    Take 1 tablet (10 mEq total) by mouth 2 (two) times daily for 7 days.     Note:  This document was prepared using Dragon voice recognition software and may include unintentional dictation errors.     Arta Silence, MD 09/14/19 2120

## 2019-09-14 NOTE — ED Notes (Signed)
See triage note, pt reports not feeling well after COVID vaccination

## 2019-09-14 NOTE — ED Triage Notes (Signed)
Pt states had 2nd Covid vaccine yesterday and today is feeling weak, having diarrhea and lower abd pain. Pt is [redacted] wks pregnant.

## 2019-11-09 ENCOUNTER — Other Ambulatory Visit: Payer: Self-pay | Admitting: Family Medicine

## 2019-11-09 DIAGNOSIS — Z3482 Encounter for supervision of other normal pregnancy, second trimester: Secondary | ICD-10-CM

## 2019-11-17 ENCOUNTER — Ambulatory Visit
Admission: RE | Admit: 2019-11-17 | Discharge: 2019-11-17 | Disposition: A | Discharge status: Home / Self Care | Payer: Medicaid Other | Source: Ambulatory Visit | Attending: Family Medicine | Admitting: Family Medicine

## 2019-11-17 ENCOUNTER — Other Ambulatory Visit: Payer: Self-pay

## 2019-11-17 DIAGNOSIS — Z3482 Encounter for supervision of other normal pregnancy, second trimester: Secondary | ICD-10-CM | POA: Diagnosis present

## 2019-11-18 ENCOUNTER — Ambulatory Visit: Payer: Medicaid Other

## 2020-01-01 ENCOUNTER — Other Ambulatory Visit: Payer: Self-pay

## 2020-01-01 ENCOUNTER — Emergency Department
Admission: EM | Admit: 2020-01-01 | Discharge: 2020-01-01 | Disposition: A | Discharge status: Home / Self Care | Payer: Medicaid Other | Attending: Emergency Medicine | Admitting: Emergency Medicine

## 2020-01-01 DIAGNOSIS — Y929 Unspecified place or not applicable: Secondary | ICD-10-CM | POA: Diagnosis not present

## 2020-01-01 DIAGNOSIS — E039 Hypothyroidism, unspecified: Secondary | ICD-10-CM | POA: Insufficient documentation

## 2020-01-01 DIAGNOSIS — S30861A Insect bite (nonvenomous) of abdominal wall, initial encounter: Secondary | ICD-10-CM | POA: Diagnosis not present

## 2020-01-01 DIAGNOSIS — Z3A00 Weeks of gestation of pregnancy not specified: Secondary | ICD-10-CM | POA: Insufficient documentation

## 2020-01-01 DIAGNOSIS — O9A213 Injury, poisoning and certain other consequences of external causes complicating pregnancy, third trimester: Secondary | ICD-10-CM | POA: Insufficient documentation

## 2020-01-01 DIAGNOSIS — Y939 Activity, unspecified: Secondary | ICD-10-CM | POA: Insufficient documentation

## 2020-01-01 DIAGNOSIS — Y999 Unspecified external cause status: Secondary | ICD-10-CM | POA: Diagnosis not present

## 2020-01-01 DIAGNOSIS — Z79899 Other long term (current) drug therapy: Secondary | ICD-10-CM | POA: Diagnosis not present

## 2020-01-01 DIAGNOSIS — W57XXXA Bitten or stung by nonvenomous insect and other nonvenomous arthropods, initial encounter: Secondary | ICD-10-CM | POA: Insufficient documentation

## 2020-01-01 MED ORDER — ACETAMINOPHEN 325 MG PO TABS: 650.0000 mg | ORAL_TABLET | Freq: Once | ORAL | Status: DC

## 2020-01-01 MED ORDER — DIPHENHYDRAMINE HCL 25 MG PO CAPS: 25.0000 mg | ORAL_CAPSULE | Freq: Once | ORAL | Status: DC

## 2020-01-01 NOTE — ED Notes (Signed)
No answer when called to triage.

## 2020-01-01 NOTE — ED Triage Notes (Signed)
Pt states she took 650mg  tylenol at 2300.

## 2020-01-01 NOTE — ED Provider Notes (Signed)
[facility] Emergency Department Provider Note  ____________________________________________   First MD Initiated Contact with Patient 01/01/20 0820     (approximate)  I have reviewed the triage vital signs and the nursing notes.   HISTORY  Chief Complaint Insect Bite   HPI Beth Strong is a 39 y.o. female with a past medical history of depression, GERD, hypothyroidism, and current uncomplicated pregnancy approximately 6 months along who presents for assessment with concerns for an insect bite to her right lower abdomen that she thinks occurred yesterday afternoon.  [place] but did not see a specific insect.  She states that has had some burning since then.  She thinks the burning has improved since she initially felt it and the associated redness is also slightly improved.  She denies any fevers, chills, cough, shortness of breath, nausea, vomiting, diarrhea, dysuria, abdominal pain, chest pain, other areas of rash or pain or any other acute complaints.  She does note she took some Tylenol yesterday she is not sure if this helped.  No prior similar episodes.  No other clear alleviating aggravating factors.         Past Medical History:  Diagnosis Date  . Thyroid disease     Patient Active Problem List   Diagnosis Date Noted  . Depression, postpartum 03/30/2014  . GERD (gastroesophageal reflux disease) 03/30/2014  . Hypothyroidism, unspecified 03/30/2014  . Supervision of normal pregnancy 03/30/2014    No past surgical history on file.  Prior to Admission medications   Medication Sig Start Date End Date Taking? Authorizing Provider  levothyroxine (SYNTHROID, LEVOTHROID) 100 MCG tablet Take 100 mcg by mouth daily before breakfast.    [provider]  pantoprazole (PROTONIX) 40 MG tablet Take 40 mg by mouth daily.    [provider]  potassium chloride (KLOR-CON) 10 MEQ tablet Take 1  tablet (10 mEq total) by mouth 2 (two) times daily for 7 days. 09/14/19 09/21/19  Dionne Bucy, MD    Allergies Patient has no known allergies.  Family History  Problem Relation Age of Onset  . Diabetes Mother     Social History Social History   Tobacco Use  . Smoking status: Never Smoker  . Smokeless tobacco: Never Used  Vaping Use  . Vaping Use: Never used  Substance Use Topics  . Alcohol use: No  . Drug use: No    Review of Systems  Review of Systems  Constitutional: Negative for chills and fever.  HENT: Negative for sore throat.   Eyes: Negative for pain.  Respiratory: Negative for cough and stridor.   Cardiovascular: Negative for chest pain.  Gastrointestinal: Negative for vomiting.  Genitourinary: Negative for dysuria.  Musculoskeletal: Negative for back pain and joint pain.  Skin: Negative for rash.  Neurological: Negative for seizures, loss of consciousness and headaches.  Psychiatric/Behavioral: Negative for suicidal ideas.  All other systems reviewed and are negative.     ____________________________________________   PHYSICAL EXAM:  VITAL SIGNS: ED Triage Vitals [01/01/20 0020]  Enc Vitals Group     BP 118/81     Pulse Rate 100     Resp 20     Temp 99 F (37.2 C)     Temp Source Oral     SpO2 100 %     Weight 168 lb (76.2 kg)     Height 5\' 3"  (1.6 m)     Head Circumference  Peak Flow      Pain Score 8     Pain Loc      Pain Edu?      Excl. in GC?    Vitals:   01/01/20 0020 01/01/20 0807  BP: 118/81 (!) 100/59  Pulse: 100 98  Resp: 20 14  Temp: 99 F (37.2 C)   SpO2: 100% 100%   Physical Exam Vitals and nursing note reviewed.  Constitutional:      General: She is not in acute distress.    Appearance: She is well-developed.  HENT:     Head: Normocephalic and atraumatic.     Right Ear: External ear normal.     Left Ear: External ear normal.     Nose: Nose normal.     Mouth/Throat:     Mouth: Mucous membranes are  moist.  Eyes:     Conjunctiva/sclera: Conjunctivae normal.  Cardiovascular:     Rate and Rhythm: Normal rate and regular rhythm.     Heart sounds: No murmur heard.   Pulmonary:     Effort: Pulmonary effort is normal. No respiratory distress.     Breath sounds: Normal breath sounds.  Abdominal:     Palpations: Abdomen is soft.     Tenderness: There is no abdominal tenderness.  Musculoskeletal:     Cervical back: Neck supple.  Skin:    General: Skin is warm and dry.  Neurological:     Mental Status: She is alert and oriented to person, place, and time.  Psychiatric:        Mood and Affect: Mood normal.     Patient has approximately 3 cm circular area of erythema that is raised and resembles a wheal.  It is blanchable and is located over her right flank.  No surrounding streaking, fluctuance, or other significant skin changes.  ____________________________________________   PROCEDURES  Procedure(s) performed (including Critical Care):  Procedures   ____________________________________________   INITIAL IMPRESSION / ASSESSMENT AND PLAN / ED COURSE        Patient presents for assessment with concerns for an insect bite to her right flank.  Afebrile hemodynamically stable on arrival.  Exam as above.  Overall patient's presentation is not consistent with anaphylactic reaction, cellulitis, she does not have any history or exam findings to suggest traumatic injury or significant metabolic derangement.  Discussed expected clinical course as well as reasons to return to emergency room including worsening of redness pain swelling or development of shortness of breath, nausea, vomiting, or diarrhea.  Patient was treated with below noted medications and discharged stable condition with plan to follow-up with her PCP later this week.  Medications  acetaminophen (TYLENOL) tablet 650 mg (has no administration in time range)  diphenhydrAMINE (BENADRYL) capsule 25 mg (has no  administration in time range)   ____________________________________________   FINAL CLINICAL IMPRESSION(S) / ED DIAGNOSES  Final diagnoses:  Insect bite of abdominal wall, initial encounter     ED Discharge Orders    None       Note:  This document was prepared using Dragon voice recognition software and may include unintentional dictation errors.   Gilles Chiquito, MD 01/01/20 [phone]

## 2020-01-01 NOTE — ED Triage Notes (Signed)
Pt states she was bitten by something approx 2030 to right flank. Pt with redness noted to right flank approx size of quarter. Pt states area is painful, does not itch.

## 2020-01-01 NOTE — ED Notes (Signed)
Pt answered phone. Pt coming to be seen from parking lot per pt report.

## 2021-03-18 ENCOUNTER — Ambulatory Visit
Admission: RE | Admit: 2021-03-18 | Discharge: 2021-03-18 | Disposition: A | Discharge status: Home / Self Care | Payer: Medicaid Other | Attending: Family Medicine | Admitting: Family Medicine

## 2021-03-18 ENCOUNTER — Ambulatory Visit
Admission: RE | Admit: 2021-03-18 | Discharge: 2021-03-18 | Disposition: A | Discharge status: Home / Self Care | Payer: Medicaid Other | Source: Ambulatory Visit | Attending: Family Medicine | Admitting: Family Medicine

## 2021-03-18 ENCOUNTER — Other Ambulatory Visit: Payer: Self-pay | Admitting: Family Medicine

## 2021-03-18 DIAGNOSIS — R059 Cough, unspecified: Secondary | ICD-10-CM

## 2021-05-25 IMAGING — US US OB COMP LESS 14 WK
1 series · 14 of 28 positions shown · non-contrast
Comparison: None.

CLINICAL DATA: Pelvic pain since this morning, quantitative beta
HCG 122,460

EXAM:
OBSTETRIC <14 WK ULTRASOUND
TECHNIQUE: Transabdominal ultrasound was performed for evaluation of the
gestation as well as the maternal uterus and adnexal regions.

[Series 1: us ob comp less 14 wks · 14 of 43 slices shown]
[im 2/43]
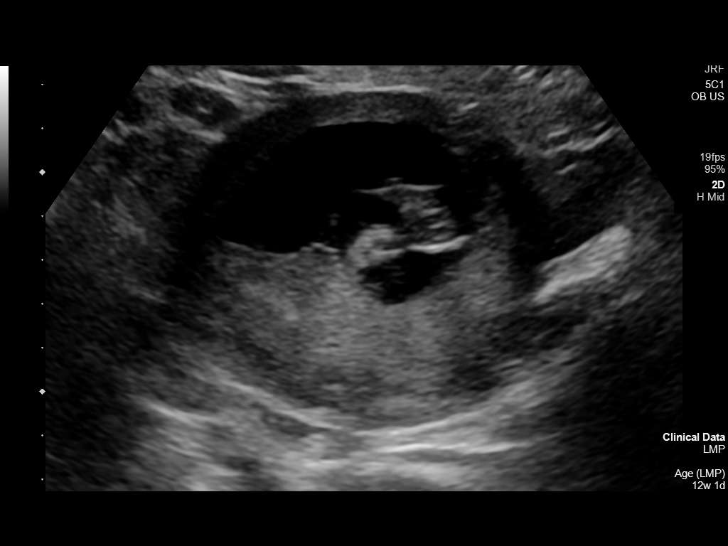
[im 5/43]
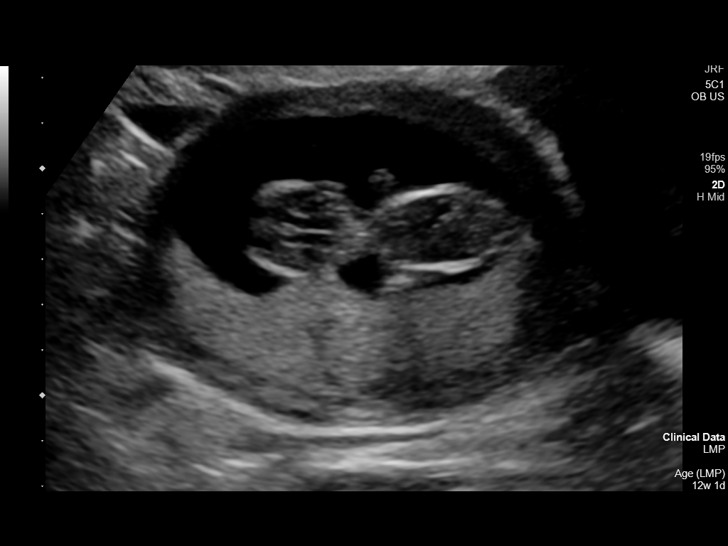
[im 8/43]
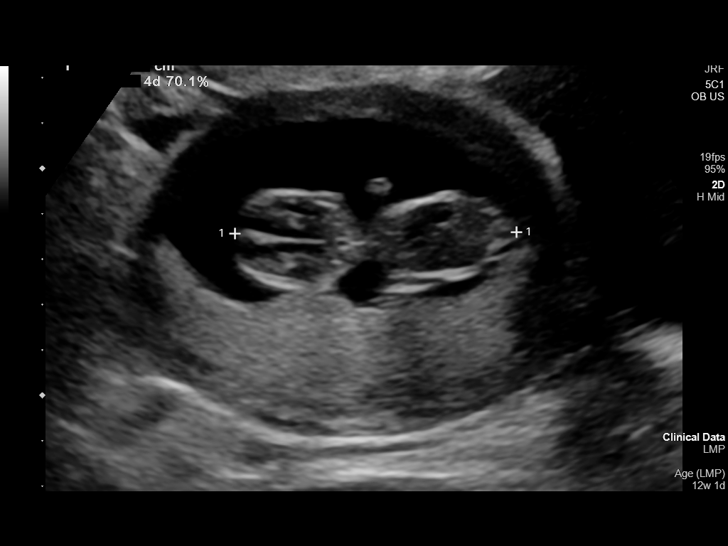
[im 11/43]
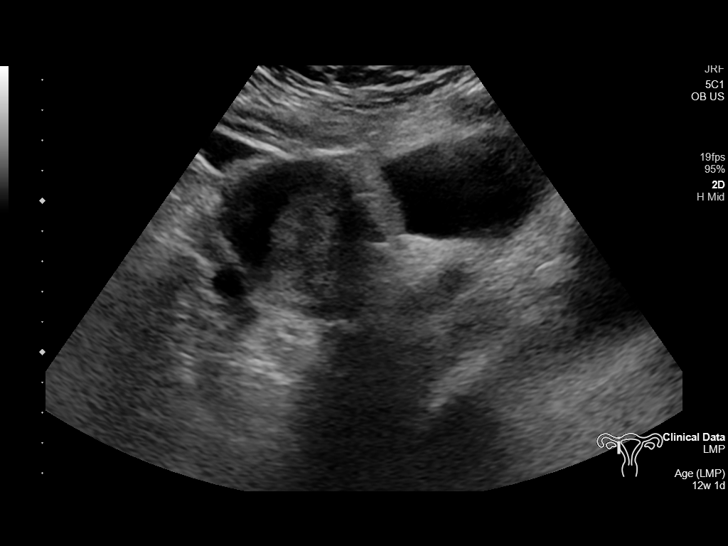
[im 15/43]
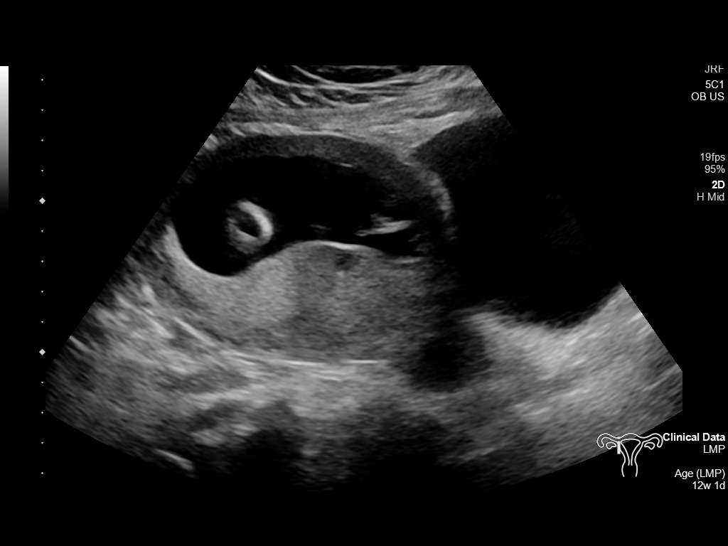
[im 18/43]
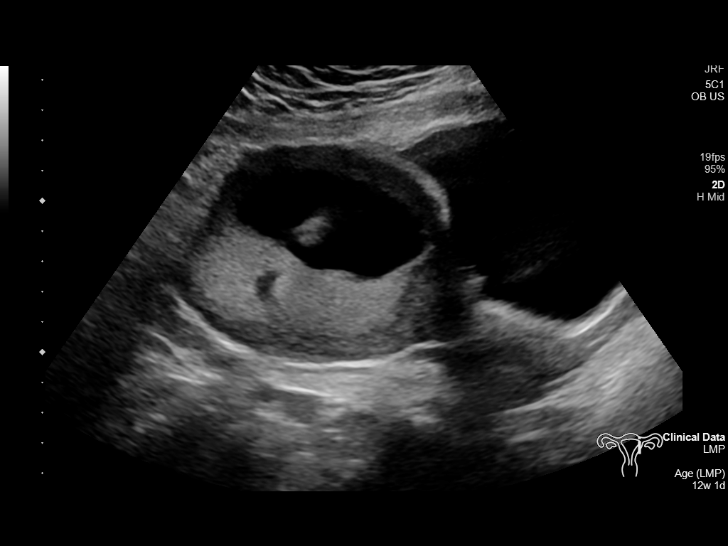
[im 21/43]
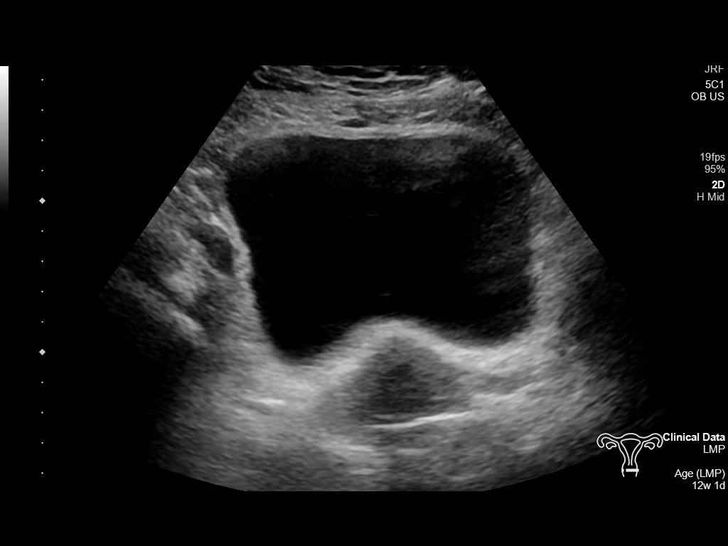
[im 24/43]
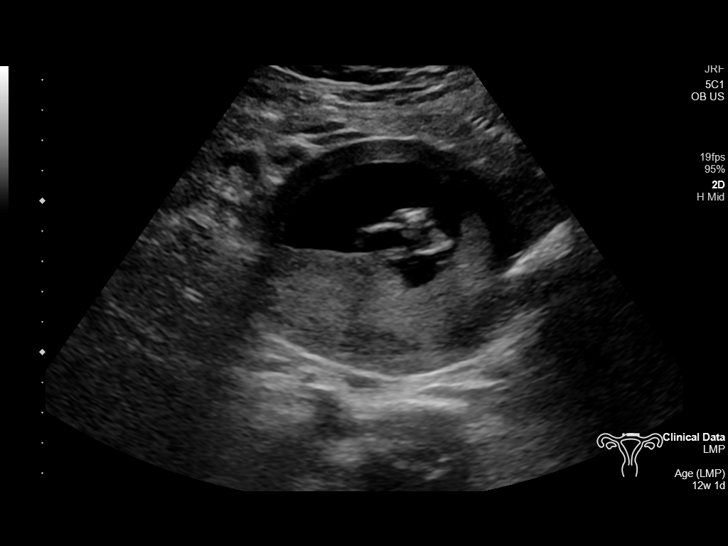
[im 27/43]
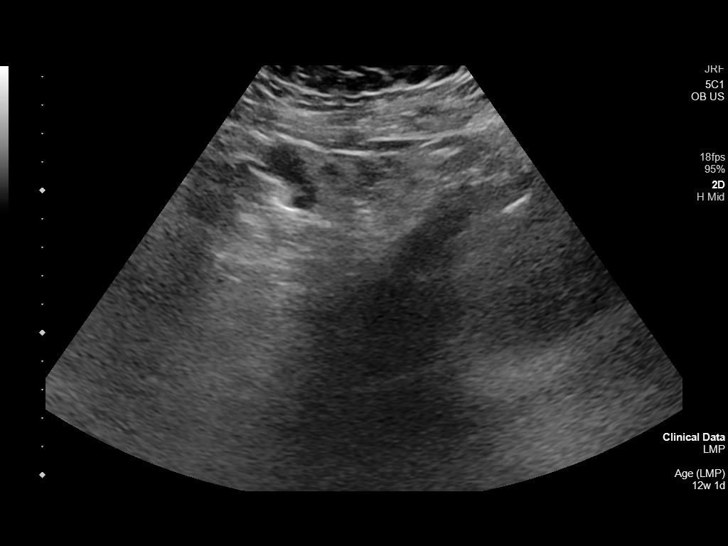
[im 30/43]
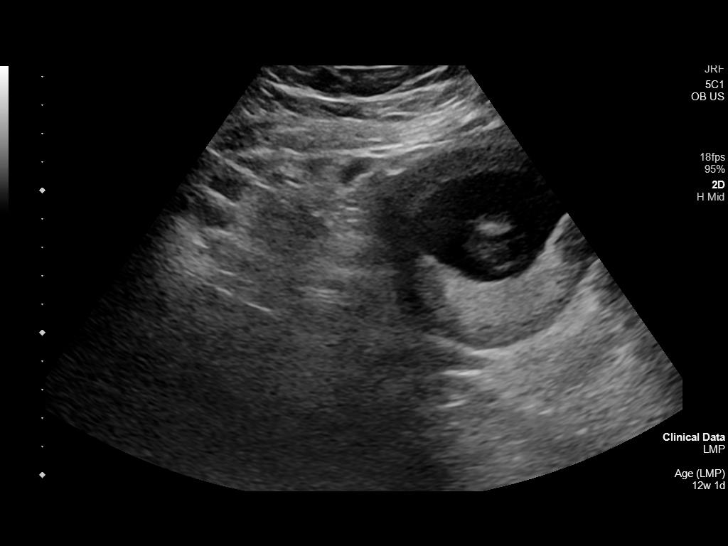
[im 33/43]
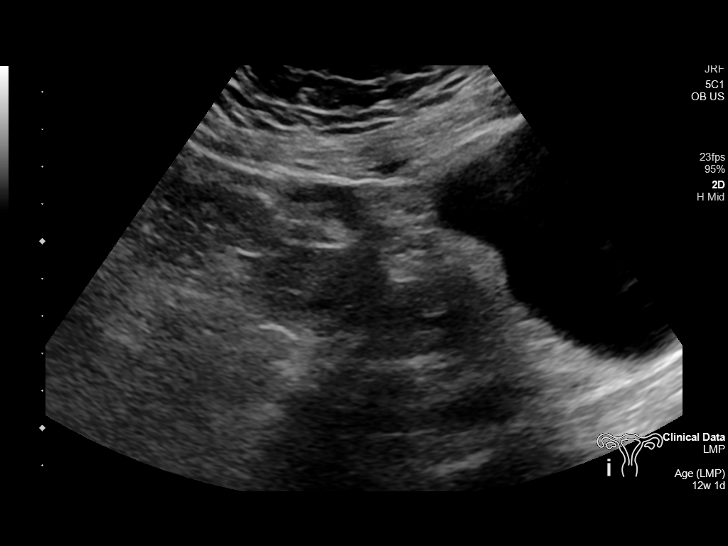
[im 36/43]
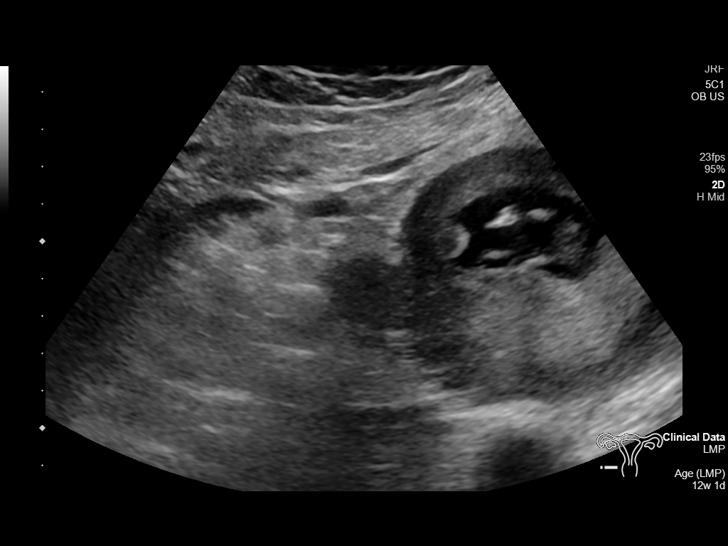
[im 39/43]
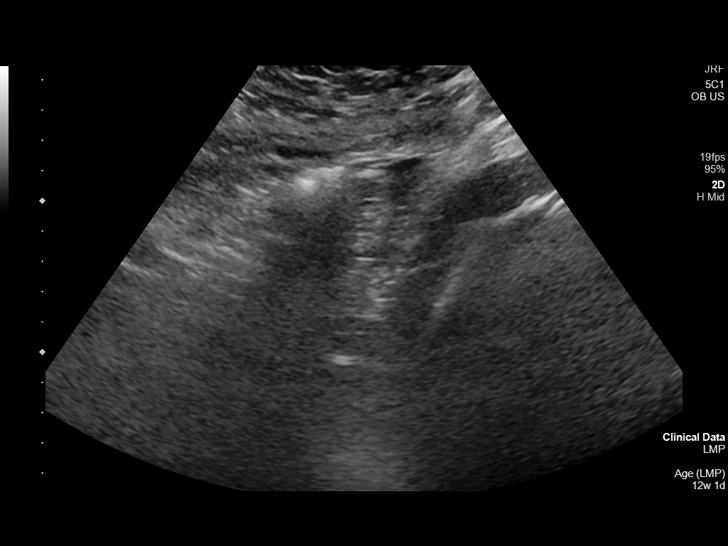
[im 43/43]
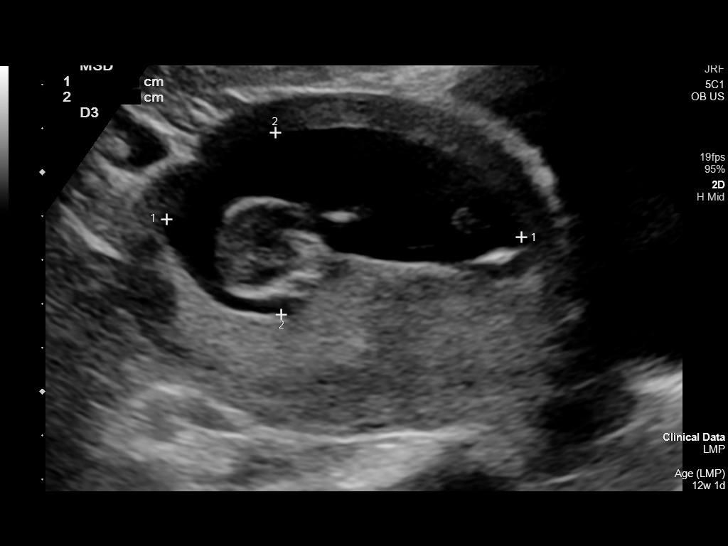

[14 of 28 positions shown; findings below may reference images not displayed]

FINDINGS: Intrauterine gestational sac: Single

Yolk sac:  Not Visualized.

Embryo:  Visualized.

Cardiac Activity: Visualized.

Heart Rate: 162 bpm

CRL:   61.7 mm   12 w 4 d                  US EDC: 03/24/2020

Subchorionic hemorrhage:  None visualized.

Maternal uterus/adnexae: Right ovary measures 3.4 x 2.2 x 2.1 cm and
is unremarkable. The left ovary is not well seen. No free fluid or
pelvic mass.
IMPRESSION: 1. Single live intrauterine pregnancy as above, estimated age 12
weeks and 4 days. No focal abnormalities.
2. Nonvisualization left ovary.  Normal right ovary.

## 2022-05-07 ENCOUNTER — Encounter: Payer: Self-pay | Admitting: Emergency Medicine

## 2022-05-07 ENCOUNTER — Emergency Department
Admission: EM | Admit: 2022-05-07 | Discharge: 2022-05-08 | Disposition: A | Discharge status: Home / Self Care | Payer: Medicaid Other | Attending: Emergency Medicine | Admitting: Emergency Medicine

## 2022-05-07 ENCOUNTER — Emergency Department: Payer: Medicaid Other

## 2022-05-07 DIAGNOSIS — E039 Hypothyroidism, unspecified: Secondary | ICD-10-CM | POA: Diagnosis not present

## 2022-05-07 DIAGNOSIS — M940 Chondrocostal junction syndrome [Tietze]: Secondary | ICD-10-CM | POA: Diagnosis not present

## 2022-05-07 DIAGNOSIS — R079 Chest pain, unspecified: Secondary | ICD-10-CM | POA: Diagnosis present

## 2022-05-07 DIAGNOSIS — Z79890 Hormone replacement therapy (postmenopausal): Secondary | ICD-10-CM | POA: Diagnosis not present

## 2022-05-07 LAB — CBC
HCT: 34.9 % — ABNORMAL LOW (ref 36.0–46.0)
Hemoglobin: 11.4 g/dL — ABNORMAL LOW (ref 12.0–15.0)
MCH: 27.6 pg (ref 26.0–34.0)
MCHC: 32.7 g/dL (ref 30.0–36.0)
MCV: 84.5 fL (ref 80.0–100.0)
Platelets: 338 10*3/uL (ref 150–400)
RBC: 4.13 MIL/uL (ref 3.87–5.11)
RDW: 12.8 % (ref 11.5–15.5)
WBC: 12.2 10*3/uL — ABNORMAL HIGH (ref 4.0–10.5)
nRBC: 0 % (ref 0.0–0.2)

## 2022-05-07 LAB — HEPATIC FUNCTION PANEL
ALT: 16 U/L (ref 0–44)
AST: 21 U/L (ref 15–41)
Albumin: 3.6 g/dL (ref 3.5–5.0)
Alkaline Phosphatase: 64 U/L (ref 38–126)
Bilirubin, Direct: <0.1 mg/dL (ref 0.0–0.2)
Total Bilirubin: 0.3 mg/dL (ref 0.3–1.2)
Total Protein: 7.7 g/dL (ref 6.5–8.1)

## 2022-05-07 LAB — BASIC METABOLIC PANEL
Anion gap: 9 (ref 5–15)
BUN: 16 mg/dL (ref 6–20)
CO2: 23 mmol/L (ref 22–32)
Calcium: 8.6 mg/dL — ABNORMAL LOW (ref 8.9–10.3)
Chloride: 105 mmol/L (ref 98–111)
Creatinine, Ser: 0.67 mg/dL (ref 0.44–1.00)
GFR, Estimated: >60 mL/min (ref >60)
Glucose, Bld: 136 mg/dL — ABNORMAL HIGH (ref 70–99)
Potassium: 3.6 mmol/L (ref 3.5–5.1)
Sodium: 137 mmol/L (ref 135–145)

## 2022-05-07 LAB — LIPASE, BLOOD: Lipase: 34 U/L (ref 11–51)

## 2022-05-07 LAB — TROPONIN I (HIGH SENSITIVITY): Troponin I (High Sensitivity): 3 ng/L (ref <18)

## 2022-05-07 NOTE — ED Provider Triage Note (Signed)
Emergency Medicine Provider Triage Evaluation Note  Beth Strong, a 41 y.o. female  was evaluated in triage.  Pt complains of left breast and chest wall pain. She note exquiste tenderness to light touch. No skin changes noted  Review of Systems  Positive: Left breast tenderness Negative: FCS, skin changes  Physical Exam  BP 121/76 (BP Location: Left Arm)   Pulse 88   Temp 98.2 F (36.8 C) (Oral)   Resp 18   Ht 5\' 2"  (1.575 m)   Wt 76.2 kg   LMP 04/21/2022 (Exact Date)   SpO2 99%   BMI 30.73 kg/m  Gen:   Awake, no distress  NAD Resp:  Normal effort  MSK:   Moves extremities without difficulty  Other:    Medical Decision Making  Medically screening exam initiated at 10:14 PM.  Appropriate orders placed.  Beth Strong was informed that the remainder of the evaluation will be completed by another provider, this initial triage assessment does not replace that evaluation, and the importance of remaining in the ED until their evaluation is complete.  Patient to the ED for evaluation of exquisite tenderness to the left chest wall and breast.  She reports a burning sensation to the axilla.  Pain is increased with light touch.  No FCS reported.   Georgeann Oppenheim, PA-C 05/07/22 2216

## 2022-05-07 NOTE — ED Triage Notes (Signed)
Pt presents via POV with complaints of left sided breast/CP with radiation to the axilla and more pain when taking a deep breath. Pt rates the pain 8-9/10. Describes the pain as "burning pain". No redness or swelling noted. Denies fevers, chills, N/V/D, or SOB.

## 2022-05-08 ENCOUNTER — Emergency Department: Payer: Medicaid Other

## 2022-05-08 LAB — HCG, QUANTITATIVE, PREGNANCY: hCG, Beta Chain, Quant, S: <1 m[IU]/mL (ref <5)

## 2022-05-08 LAB — TROPONIN I (HIGH SENSITIVITY): Troponin I (High Sensitivity): <2 ng/L (ref <18)

## 2022-05-08 MED ORDER — NAPROXEN 500 MG PO TABS: 500.0000 mg | ORAL_TABLET | Freq: Two times a day (BID) | ORAL | 0 refills | Status: AC

## 2022-05-08 MED ORDER — SODIUM CHLORIDE 0.9 % IV BOLUS
1000.0000 mL | Freq: Once | INTRAVENOUS | Status: AC
  Administered 2022-05-08: 1000 mL via INTRAVENOUS

## 2022-05-08 MED ORDER — HYDROCODONE-ACETAMINOPHEN 5-325 MG PO TABS
1.0000 | ORAL_TABLET | Freq: Once | ORAL | Status: AC
  Administered 2022-05-08: 1 via ORAL
  Filled 2022-05-08: qty 1

## 2022-05-08 MED ORDER — KETOROLAC TROMETHAMINE 30 MG/ML IJ SOLN
15.0000 mg | Freq: Once | INTRAMUSCULAR | Status: AC
  Administered 2022-05-08: 15 mg via INTRAVENOUS
  Filled 2022-05-08: qty 1

## 2022-05-08 MED ORDER — IOHEXOL 350 MG/ML SOLN
75.0000 mL | Freq: Once | INTRAVENOUS | Status: AC | PRN
  Administered 2022-05-08: 75 mL via INTRAVENOUS

## 2022-05-08 MED ORDER — HYDROCODONE-ACETAMINOPHEN 5-325 MG PO TABS: 1.0000 | ORAL_TABLET | Freq: Four times a day (QID) | ORAL | 0 refills | Status: AC | PRN

## 2022-05-08 NOTE — ED Provider Notes (Signed)
[facility] Provider Note    Event Date/Time   First MD Initiated Contact with Patient 05/08/22 0012     (approximate)   History   Chest Pain   HPI  Beth Strong is a 41 y.o. female who presents to the ED from home with a chief complaint of sharp left-sided breast/chest pain x 2 days.  Exacerbated by deep breath.  Describes sensation as burning type pain and first felt it when she picked up her 47-year-old toddler.  Denies associated fever, cough, diaphoresis, shortness of breath, abdominal pain, nausea, vomiting, palpitations or dizziness     Past Medical History   Past Medical History:  Diagnosis Date   Thyroid disease      Active Problem List   Patient Active Problem List   Diagnosis Date Noted   Depression, postpartum 03/30/2014   GERD (gastroesophageal reflux disease) 03/30/2014   Hypothyroidism, unspecified 03/30/2014   Supervision of normal pregnancy 03/30/2014     Past Surgical History  History reviewed. No pertinent surgical history.   Home Medications   Prior to Admission medications   Medication Sig Start Date End Date Taking? Authorizing Provider  levothyroxine (SYNTHROID, LEVOTHROID) 100 MCG tablet Take 100 mcg by mouth daily before breakfast.    [provider]  pantoprazole (PROTONIX) 40 MG tablet Take 40 mg by mouth daily.    [provider]  potassium chloride (KLOR-CON) 10 MEQ tablet Take 1 tablet (10 mEq total) by mouth 2 (two) times daily for 7 days. 09/14/19 09/21/19  Dionne Bucy, MD     Allergies  Patient has no known allergies.   Family History   Family History  Problem Relation Age of Onset   Diabetes Mother      Physical Exam  Triage Vital Signs: ED Triage Vitals  Enc Vitals Group     BP 05/07/22 2202 121/76     Pulse Rate 05/07/22 2202 88     Resp 05/07/22 2202 18     Temp 05/07/22 2202 98.2 F (36.8 C)     Temp Source 05/07/22 2202 Oral     SpO2 05/07/22 2202 99 %      Weight 05/07/22 2203 168 lb (76.2 kg)     Height 05/07/22 2203 5\' 2"  (1.575 m)     Head Circumference --      Peak Flow --      Pain Score 05/07/22 2211 8     Pain Loc --      Pain Edu? --      Excl. in GC? --     Updated Vital Signs: BP 113/70 (BP Location: Right Arm)   Pulse 74   Temp 98.3 F (36.8 C) (Oral)   Resp 16   Ht 5\' 2"  (1.575 m)   Wt 76.2 kg   LMP 04/21/2022 (Exact Date)   SpO2 99%   BMI 30.73 kg/m    General: Awake, no distress.  CV:  RRR.  Good peripheral perfusion.  Resp:  Normal effort.  CTAB. Abd:  Nontender.  No distention.  Other:  Normal left breast exam.  No truncal vesicles.   ED Results / Procedures / Treatments  Labs (all labs ordered are listed, but only abnormal results are displayed) Labs Reviewed  BASIC METABOLIC PANEL - Abnormal; Notable for the following components:      Result Value   Glucose, Bld 136 (*)    Calcium 8.6 (*)    All other components within normal limits  CBC -  Abnormal; Notable for the following components:   WBC 12.2 (*)    Hemoglobin 11.4 (*)    HCT 34.9 (*)    All other components within normal limits  HEPATIC FUNCTION PANEL  LIPASE, BLOOD  HCG, QUANTITATIVE, PREGNANCY  POC URINE PREG, ED  TROPONIN I (HIGH SENSITIVITY)  TROPONIN I (HIGH SENSITIVITY)     EKG  ED ECG REPORT I, Klaryssa Fauth J, the attending physician, personally viewed and interpreted this ECG.   Date: 05/08/2022  EKG Time: 2200  Rate: 78  Rhythm: normal sinus rhythm  Axis: Normal  Intervals:none  ST&T Change: Nonspecific    RADIOLOGY I have independently visualized and interpreted patient's CT and chest x-ray as well as noted the radiology interpretation:  Chest x-ray: No acute cardiopulmonary process  CTA chest: No PE  Official radiology report(s): CT Angio Chest PE W/Cm &/Or Wo Cm  Result Date: 05/08/2022 CLINICAL DATA:  Concern for pulmonary embolism. EXAM: CT ANGIOGRAPHY CHEST WITH CONTRAST TECHNIQUE: Multidetector CT  imaging of the chest was performed using the standard protocol during bolus administration of intravenous contrast. Multiplanar CT image reconstructions and MIPs were obtained to evaluate the vascular anatomy. RADIATION DOSE REDUCTION: This exam was performed according to the departmental dose-optimization program which includes automated exposure control, adjustment of the mA and/or kV according to patient size and/or use of iterative reconstruction technique. CONTRAST:  16mL OMNIPAQUE IOHEXOL 350 MG/ML SOLN COMPARISON:  Chest radiograph dated 05/07/2022. FINDINGS: Cardiovascular: There is no cardiomegaly or pericardial effusion. The thoracic aorta is unremarkable. The origins of the great vessels of the aortic arch appear patent. No pulmonary artery embolus identified. Mediastinum/Nodes: No hilar or mediastinal adenopathy. The esophagus is grossly unremarkable. No mediastinal fluid collection. Lungs/Pleura: No focal consolidation, pleural effusion, or pneumothorax. The central airways are patent. Upper Abdomen: No acute abnormality. Musculoskeletal: No acute osseous pathology. Review of the MIP images confirms the above findings. IMPRESSION: No acute intrathoracic pathology. No CT evidence of pulmonary artery embolus. Electronically Signed   By: Elgie Collard M.D.   On: 05/08/2022 01:51   DG Chest 2 View  Result Date: 05/07/2022 CLINICAL DATA:  Chest pain. EXAM: CHEST - 2 VIEW COMPARISON:  Chest radiograph dated March 18, 2021 FINDINGS: The heart size and mediastinal contours are within normal limits. Both lungs are clear. The visualized skeletal structures are unremarkable. IMPRESSION: No active cardiopulmonary disease. Electronically Signed   By: Larose Hires D.O.   On: 05/07/2022 22:37     PROCEDURES:  Critical Care performed: No  Procedures   MEDICATIONS ORDERED IN ED: Medications  ketorolac (TORADOL) 30 MG/ML injection 15 mg (15 mg Intravenous Given 05/08/22 0100)  sodium chloride 0.9 %  bolus 1,000 mL (1,000 mLs Intravenous New Bag/Given 05/08/22 0101)  iohexol (OMNIPAQUE) 350 MG/ML injection 75 mL (75 mLs Intravenous Contrast Given 05/08/22 0113)     IMPRESSION / MDM / ASSESSMENT AND PLAN / ED COURSE  I reviewed the triage vital signs and the nursing notes.                             41 year old female presenting with chest pain. Differential diagnosis includes, but is not limited to, ACS, aortic dissection, pulmonary embolism, cardiac tamponade, pneumothorax, pneumonia, pericarditis, myocarditis, GI-related causes including esophagitis/gastritis, and musculoskeletal chest wall pain.   I have personally reviewed patient's records and note OB delivery note from 03/17/2020.  Patient's presentation is most consistent with acute presentation with potential threat to life  or bodily function.  Laboratory results demonstrate mild leukocytosis WBC 12.2, normal electrolytes including LFTs/lipase.  Initial troponin unremarkable with negative chest x-ray.  Will repeat time troponin.  Given pleuritic nature of patient's pain, will obtain CTA chest to evaluate for PE.  Administer IV fluids and ketorolac for pain.  Will reassess.  Clinical Course as of 05/08/22 0214  Thu May 08, 2022  0212 Updated updated patient repeat troponin and negative CTA chest for PE.  Will add Norco at this time.  Discharge home with as needed NSAIDs and analgesia.  Will follow-up closely with her PCP.  Strict return cautions given.  Both verbalized understanding and agree with plan of care. [JS]    Clinical Course User Index [JS] Irean Hong, MD     FINAL CLINICAL IMPRESSION(S) / ED DIAGNOSES   Final diagnoses:  Nonspecific chest pain  Costochondritis     Rx / DC Orders   ED Discharge Orders     None        Note:  This document was prepared using Dragon voice recognition software and may include unintentional dictation errors.   Irean Hong, MD 05/08/22 [phone]

## 2022-05-08 NOTE — Discharge Instructions (Signed)
You may take medicines as needed for chest pain (Naprosyn/Norco).  Apply moist heat to affected area several times daily.  Return to the ER for worsening symptoms, persistent vomiting, difficulty breathing or other concerns.

## 2022-05-23 ENCOUNTER — Other Ambulatory Visit: Payer: Self-pay | Admitting: Family Medicine

## 2022-05-23 DIAGNOSIS — N644 Mastodynia: Secondary | ICD-10-CM

## 2022-06-02 ENCOUNTER — Ambulatory Visit
Admission: RE | Admit: 2022-06-02 | Discharge: 2022-06-02 | Disposition: A | Discharge status: Home / Self Care | Payer: Medicaid Other | Source: Ambulatory Visit | Attending: Family Medicine | Admitting: Family Medicine

## 2022-06-02 DIAGNOSIS — N644 Mastodynia: Secondary | ICD-10-CM

## 2022-08-04 ENCOUNTER — Emergency Department: Payer: Medicaid Other

## 2022-08-04 ENCOUNTER — Emergency Department
Admission: EM | Admit: 2022-08-04 | Discharge: 2022-08-04 | Disposition: A | Discharge status: Home / Self Care | Payer: Medicaid Other | Attending: Emergency Medicine | Admitting: Emergency Medicine

## 2022-08-04 ENCOUNTER — Encounter: Payer: Self-pay | Admitting: Emergency Medicine

## 2022-08-04 DIAGNOSIS — M25511 Pain in right shoulder: Secondary | ICD-10-CM | POA: Diagnosis present

## 2022-08-04 DIAGNOSIS — Y92410 Unspecified street and highway as the place of occurrence of the external cause: Secondary | ICD-10-CM | POA: Insufficient documentation

## 2022-08-04 DIAGNOSIS — M79631 Pain in right forearm: Secondary | ICD-10-CM | POA: Insufficient documentation

## 2022-08-04 DIAGNOSIS — R519 Headache, unspecified: Secondary | ICD-10-CM | POA: Diagnosis not present

## 2022-08-04 MED ORDER — ONDANSETRON 4 MG PO TBDP
4.0000 mg | ORAL_TABLET | Freq: Once | ORAL | Status: AC
  Administered 2022-08-04: 4 mg via ORAL
  Filled 2022-08-04: qty 1

## 2022-08-04 MED ORDER — MELOXICAM 15 MG PO TABS: 15.0000 mg | ORAL_TABLET | Freq: Every day | ORAL | 1 refills | Status: AC

## 2022-08-04 MED ORDER — OXYCODONE-ACETAMINOPHEN 5-325 MG PO TABS
1.0000 | ORAL_TABLET | Freq: Once | ORAL | Status: AC
  Administered 2022-08-04: 1 via ORAL
  Filled 2022-08-04: qty 1

## 2022-08-04 MED ORDER — METHOCARBAMOL 500 MG PO TABS: 500.0000 mg | ORAL_TABLET | Freq: Three times a day (TID) | ORAL | 0 refills | Status: AC | PRN
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
## 2022-08-04 NOTE — Discharge Instructions (Signed)
Take Meloxicam and Robaxin as directed.  °
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
## 2022-08-04 NOTE — ED Notes (Signed)
Rt arm/shoulder pain, mild bruising noted to forearm. Also c/o head pain

## 2022-08-25 ENCOUNTER — Ambulatory Visit: Payer: No Typology Code available for payment source
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
## 2022-08-28 ENCOUNTER — Ambulatory Visit: Payer: Medicaid Other | Attending: Orthopedic Surgery

## 2022-08-28 DIAGNOSIS — M542 Cervicalgia: Secondary | ICD-10-CM | POA: Diagnosis present

## 2022-08-28 DIAGNOSIS — R293 Abnormal posture: Secondary | ICD-10-CM | POA: Diagnosis present

## 2022-08-28 DIAGNOSIS — M25511 Pain in right shoulder: Secondary | ICD-10-CM | POA: Insufficient documentation

## 2022-08-28 DIAGNOSIS — M6281 Muscle weakness (generalized): Secondary | ICD-10-CM | POA: Insufficient documentation

## 2022-09-01 ENCOUNTER — Ambulatory Visit: Payer: Medicaid Other

## 2022-09-01 DIAGNOSIS — M25511 Pain in right shoulder: Secondary | ICD-10-CM | POA: Diagnosis not present

## 2022-09-01 DIAGNOSIS — M542 Cervicalgia: Secondary | ICD-10-CM

## 2022-09-01 DIAGNOSIS — M6281 Muscle weakness (generalized): Secondary | ICD-10-CM
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
## 2022-09-04 ENCOUNTER — Ambulatory Visit: Payer: Medicaid Other

## 2022-09-04 DIAGNOSIS — M25511 Pain in right shoulder: Secondary | ICD-10-CM | POA: Diagnosis not present

## 2022-09-04 DIAGNOSIS — M542 Cervicalgia: Secondary | ICD-10-CM

## 2022-09-04 DIAGNOSIS — M6281 Muscle weakness (generalized): Secondary | ICD-10-CM
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
## 2022-09-09 ENCOUNTER — Ambulatory Visit: Payer: Medicaid Other

## 2022-09-09 DIAGNOSIS — M25511 Pain in right shoulder: Secondary | ICD-10-CM | POA: Diagnosis not present

## 2022-09-09 DIAGNOSIS — M542 Cervicalgia: Secondary | ICD-10-CM

## 2022-09-09 DIAGNOSIS — M6281 Muscle weakness (generalized): Secondary | ICD-10-CM
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
## 2022-09-11 ENCOUNTER — Ambulatory Visit: Payer: Medicaid Other

## 2022-09-15 ENCOUNTER — Ambulatory Visit: Payer: Medicaid Other

## 2022-09-17 ENCOUNTER — Ambulatory Visit: Payer: Medicaid Other

## 2022-09-18 ENCOUNTER — Telehealth: Payer: Self-pay

## 2022-09-18 ENCOUNTER — Ambulatory Visit: Payer: Medicaid Other | Attending: Orthopedic Surgery

## 2022-09-18 DIAGNOSIS — M6281 Muscle weakness (generalized): Secondary | ICD-10-CM

## 2022-09-18 DIAGNOSIS — M542 Cervicalgia: Secondary | ICD-10-CM | POA: Insufficient documentation

## 2022-09-18 DIAGNOSIS — G8929 Other chronic pain: Secondary | ICD-10-CM | POA: Insufficient documentation

## 2022-09-18 DIAGNOSIS — M25511 Pain in right shoulder: Secondary | ICD-10-CM | POA: Diagnosis present
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
## 2022-09-22 ENCOUNTER — Ambulatory Visit: Payer: Medicaid Other

## 2022-09-25 ENCOUNTER — Other Ambulatory Visit: Payer: Self-pay | Admitting: Family Medicine

## 2022-09-25 ENCOUNTER — Ambulatory Visit
Admission: RE | Admit: 2022-09-25 | Discharge: 2022-09-25 | Disposition: A | Discharge status: Home / Self Care | Payer: Medicaid Other | Source: Ambulatory Visit | Attending: Family Medicine | Admitting: Family Medicine

## 2022-09-25 ENCOUNTER — Ambulatory Visit: Payer: Medicaid Other

## 2022-09-25 DIAGNOSIS — M940 Chondrocostal junction syndrome [Tietze]: Secondary | ICD-10-CM

## 2022-09-25 DIAGNOSIS — M542 Cervicalgia: Secondary | ICD-10-CM | POA: Diagnosis not present

## 2022-09-25 DIAGNOSIS — M6281 Muscle weakness (generalized): Secondary | ICD-10-CM

## 2022-09-25 DIAGNOSIS — G8929 Other chronic pain: Secondary | ICD-10-CM
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
## 2022-10-02 ENCOUNTER — Telehealth: Payer: Self-pay

## 2022-10-02 ENCOUNTER — Ambulatory Visit: Payer: Medicaid Other

## 2022-10-02 NOTE — Telephone Encounter (Signed)
PT contacted pt via secure phone line due to pt's second no-show today. PT reviewed clinic policy regarding no-show/missed appointments, and that pt will need to call clinic to schedule 1 appointment at a time moving forward. Pt verbalized understanding and reports she plans to contact clinic to schedule another appointment.  Temple Pacini PT, DPT

## 2022-10-02 NOTE — Therapy (Signed)
[facility] Outpatient Rehabilitation at [facility] [address] [place], [place], [address] Phone: [phone]   Fax:  [phone]  Patient Details  Name: Beth Strong MRN: [id number] Date of Birth: 12-29-1980 Referring Provider:  Lanney Gins, PA-C  Encounter Date: 10/02/2022   Pt no-show for today's appointment. Note opened in error.  Baird Kay, PT 10/02/2022, 11:16 AM  [facility] Outpatient Rehabilitation at [facility] [address] [place], [place], [address] Phone: [phone]   Fax:  [phone]

## 2022-10-09 ENCOUNTER — Ambulatory Visit: Payer: Medicaid Other

## 2022-10-13 ENCOUNTER — Ambulatory Visit: Payer: Medicaid Other

## 2023-11-26 ENCOUNTER — Other Ambulatory Visit: Payer: Self-pay | Admitting: Family Medicine

## 2023-11-26 DIAGNOSIS — N6310 Unspecified lump in the right breast, unspecified quadrant: Secondary | ICD-10-CM

## 2023-12-01 ENCOUNTER — Ambulatory Visit
Admission: RE | Admit: 2023-12-01 | Discharge: 2023-12-01 | Disposition: A | Discharge status: Home / Self Care | Source: Ambulatory Visit | Attending: Family Medicine | Admitting: Family Medicine

## 2023-12-01 ENCOUNTER — Inpatient Hospital Stay
Admission: RE | Admit: 2023-12-01 | Discharge: 2023-12-01 | Discharge status: Home / Self Care | Source: Ambulatory Visit | Attending: Family Medicine | Admitting: Family Medicine

## 2023-12-01 DIAGNOSIS — N6310 Unspecified lump in the right breast, unspecified quadrant: Secondary | ICD-10-CM | POA: Insufficient documentation

## 2023-12-01 DIAGNOSIS — N6320 Unspecified lump in the left breast, unspecified quadrant: Secondary | ICD-10-CM | POA: Insufficient documentation

## 2024-05-16 ENCOUNTER — Ambulatory Visit
